# Patient Record
Sex: Male | Born: 1980 | Race: White | Hispanic: No | Smoking: Never smoker
Health system: Southern US, Community
[De-identification: ages and names within clinical notes are randomized; demographics above are authoritative.]

## PROBLEM LIST (undated history)

## (undated) DIAGNOSIS — I10 Essential (primary) hypertension: Secondary | ICD-10-CM

## (undated) HISTORY — PX: WISDOM TOOTH EXTRACTION: SHX21

---

## 2018-01-16 DIAGNOSIS — H1031 Unspecified acute conjunctivitis, right eye: Secondary | ICD-10-CM

## 2018-01-16 DIAGNOSIS — J0100 Acute maxillary sinusitis, unspecified: Secondary | ICD-10-CM

## 2018-01-16 DIAGNOSIS — J029 Acute pharyngitis, unspecified: Secondary | ICD-10-CM

## 2018-01-16 DIAGNOSIS — R030 Elevated blood-pressure reading, without diagnosis of hypertension: Secondary | ICD-10-CM | POA: Insufficient documentation

## 2018-01-16 HISTORY — DX: Unspecified acute conjunctivitis, right eye: H10.31

## 2018-01-16 HISTORY — DX: Acute maxillary sinusitis, unspecified: J01.00

## 2018-01-16 HISTORY — DX: Acute pharyngitis, unspecified: J02.9

## 2019-02-14 ENCOUNTER — Encounter: Payer: Self-pay | Admitting: Osteopathic Medicine

## 2019-02-14 ENCOUNTER — Other Ambulatory Visit: Payer: Self-pay

## 2019-02-14 ENCOUNTER — Ambulatory Visit (INDEPENDENT_AMBULATORY_CARE_PROVIDER_SITE_OTHER): Payer: BC Managed Care – PPO | Admitting: Osteopathic Medicine

## 2019-02-14 VITALS — BP 156/86 | HR 86 | Temp 98.1°F | Ht 69.0 in | Wt 252.4 lb

## 2019-02-14 DIAGNOSIS — Z6837 Body mass index (BMI) 37.0-37.9, adult: Secondary | ICD-10-CM | POA: Diagnosis not present

## 2019-02-14 DIAGNOSIS — R635 Abnormal weight gain: Secondary | ICD-10-CM

## 2019-02-14 DIAGNOSIS — Z7689 Persons encountering health services in other specified circumstances: Secondary | ICD-10-CM | POA: Diagnosis not present

## 2019-02-14 DIAGNOSIS — R5382 Chronic fatigue, unspecified: Secondary | ICD-10-CM | POA: Insufficient documentation

## 2019-02-14 DIAGNOSIS — I10 Essential (primary) hypertension: Secondary | ICD-10-CM

## 2019-02-14 DIAGNOSIS — R002 Palpitations: Secondary | ICD-10-CM

## 2019-02-14 HISTORY — DX: Chronic fatigue, unspecified: R53.82

## 2019-02-14 HISTORY — DX: Palpitations: R00.2

## 2019-02-14 HISTORY — DX: Essential (primary) hypertension: I10

## 2019-02-14 HISTORY — DX: Abnormal weight gain: R63.5

## 2019-02-14 MED ORDER — HYDROCHLOROTHIAZIDE 12.5 MG PO TABS: 12.5000 mg | ORAL_TABLET | Freq: Every day | ORAL | 3 refills | Status: DC

## 2019-02-14 NOTE — Patient Instructions (Addendum)
Plan:  EKG today looks normal  Labs - fasting sometime soon!   Blood pressure: Will send medication to pharmacy.  Keep an eye on BP at home Bring BP monitor to office next time you're here!  See below for DASH and Mediterranean diets for heart health and BP.   Weight: If labs indicate reason for weight issue, will address it depending what it is If labs do not show any reason for weight issue, few options:   Do nothing extra! Work on diet/exercise  Get blood pressure under control before we'd start medication with a stimulant such as Qsymia  Other medication options even if blood pressure isn't perfect would be injectables Saxenda or Ozempic, or pill Contrave  If you're thinking of a medication, please call your insurance company to see if they cover anything. We can also usually get things fairly cheaply with savings cards through the drug manufacturers.       Mediterranean Diet A Mediterranean diet refers to food and lifestyle choices that are based on the traditions of countries located on the [employer]. This way of eating has been shown to help prevent certain conditions and improve outcomes for people who have chronic diseases, like kidney disease and heart disease. What are tips for following this plan? Lifestyle  Cook and eat meals together with your family, when possible.  Drink enough fluid to keep your urine clear or pale yellow.  Be physically active every day. This includes: ? Aerobic exercise like running or swimming. ? Leisure activities like gardening, walking, or housework.  Get 7-8 hours of sleep each night.  If recommended by your health care provider, drink red wine in moderation. This means 1 glass a day for nonpregnant women and 2 glasses a day for men. A glass of wine equals 5 oz (150 mL). Reading food labels   Check the serving size of packaged foods. For foods such as rice and pasta, the serving size refers to the amount of cooked product,  not dry.  Check the total fat in packaged foods. Avoid foods that have saturated fat or trans fats.  Check the ingredients list for added sugars, such as corn syrup. Shopping  At the grocery store, buy most of your food from the areas near the walls of the store. This includes: ? Fresh fruits and vegetables (produce). ? Grains, beans, nuts, and seeds. Some of these may be available in unpackaged forms or large amounts (in bulk). ? Fresh seafood. ? Poultry and eggs. ? Low-fat dairy products.  Buy whole ingredients instead of prepackaged foods.  Buy fresh fruits and vegetables in-season from local farmers markets.  Buy frozen fruits and vegetables in resealable bags.  If you do not have access to quality fresh seafood, buy precooked frozen shrimp or canned fish, such as tuna, salmon, or sardines.  Buy small amounts of raw or cooked vegetables, salads, or olives from the deli or salad bar at your store.  Stock your pantry so you always have certain foods on hand, such as olive oil, canned tuna, canned tomatoes, rice, pasta, and beans. Cooking  Cook foods with extra-virgin olive oil instead of using butter or other vegetable oils.  Have meat as a side dish, and have vegetables or grains as your main dish. This means having meat in small portions or adding small amounts of meat to foods like pasta or stew.  Use beans or vegetables instead of meat in common dishes like chili or lasagna.  Experiment with different  cooking methods. Try roasting or broiling vegetables instead of steaming or sauteing them.  Add frozen vegetables to soups, stews, pasta, or rice.  Add nuts or seeds for added healthy fat at each meal. You can add these to yogurt, salads, or vegetable dishes.  Marinate fish or vegetables using olive oil, lemon juice, garlic, and fresh herbs. Meal planning   Plan to eat 1 vegetarian meal one day each week. Try to work up to 2 vegetarian meals, if possible.  Eat seafood  2 or more times a week.  Have healthy snacks readily available, such as: ? Vegetable sticks with hummus. ? [place] yogurt. ? Fruit and nut trail mix.  Eat balanced meals throughout the week. This includes: ? Fruit: 2-3 servings a day ? Vegetables: 4-5 servings a day ? Low-fat dairy: 2 servings a day ? Fish, poultry, or lean meat: 1 serving a day ? Beans and legumes: 2 or more servings a week ? Nuts and seeds: 1-2 servings a day ? Whole grains: 6-8 servings a day ? Extra-virgin olive oil: 3-4 servings a day  Limit red meat and sweets to only a few servings a month What are my food choices?  Mediterranean diet ? Recommended  Grains: Whole-grain pasta. Brown rice. Bulgar wheat. Polenta. Couscous. Whole-wheat bread. [place].  Vegetables: Artichokes. Beets. Broccoli. Cabbage. Carrots. Eggplant. Green beans. Chard. Kale. Spinach. Onions. Leeks. Peas. Squash. Tomatoes. Peppers. Radishes.  Fruits: Apples. Apricots. Avocado. Berries. Bananas. Cherries. Dates. Figs. Grapes. Lemons. Melon. Oranges. Peaches. Plums. Pomegranate.  Meats and other protein foods: Beans. Almonds. Sunflower seeds. Pine nuts. Peanuts. [place]. Salmon. Scallops. Shrimp. [place]. Tilapia. Clams. Oysters. Eggs.  Dairy: Low-fat milk. Cheese. Greek yogurt.  Beverages: Water. Red wine. Herbal tea.  Fats and oils: Extra virgin olive oil. Avocado oil. Grape seed oil.  Sweets and desserts: [place] yogurt with honey. Baked apples. Poached pears. Trail mix.  Seasoning and other foods: Basil. Cilantro. Coriander. Cumin. Mint. Parsley. Sage. Rosemary. Tarragon. Garlic. Oregano. Thyme. Pepper. Balsalmic vinegar. Tahini. Hummus. Tomato sauce. Olives. Mushrooms. ? Limit these  Grains: Prepackaged pasta or rice dishes. Prepackaged cereal with added sugar.  Vegetables: Deep fried potatoes (french fries).  Fruits: Fruit canned in syrup.  Meats and other protein foods: Beef. Pork. Lamb. Poultry with skin. Hot dogs.  [place] Salines.  Dairy: Ice cream. Sour cream. Whole milk.  Beverages: Juice. Sugar-sweetened soft drinks. Beer. Liquor and spirits.  Fats and oils: Butter. Canola oil. Vegetable oil. Beef fat (tallow). Lard.  Sweets and desserts: Cookies. Cakes. Pies. Candy.  Seasoning and other foods: Mayonnaise. Premade sauces and marinades. The items listed may not be a complete list. Talk with your dietitian about what dietary choices are right for you. Summary  The Mediterranean diet includes both food and lifestyle choices.  Eat a variety of fresh fruits and vegetables, beans, nuts, seeds, and whole grains.  Limit the amount of red meat and sweets that you eat.  Talk with your health care provider about whether it is safe for you to drink red wine in moderation. This means 1 glass a day for nonpregnant women and 2 glasses a day for men. A glass of wine equals 5 oz (150 mL). This information is not intended to replace advice given to you by your health care provider. Make sure you discuss any questions you have with your health care provider. Document Released: 02/04/2016 Document Revised: 02/11/2016 Document Reviewed: 02/04/2016 Elsevier Patient Education  2020 [place] DASH stands for "Dietary  Approaches to Stop Hypertension." The DASH eating plan is a healthy eating plan that has been shown to reduce high blood pressure (hypertension). It may also reduce your risk for type 2 diabetes, heart disease, and stroke. The DASH eating plan may also help with weight loss. What are tips for following this plan?  General guidelines  Avoid eating more than 2,300 mg (milligrams) of salt (sodium) a day. If you have hypertension, you may need to reduce your sodium intake to 1,500 mg a day.  Limit alcohol intake to no more than 1 drink a day for nonpregnant women and 2 drinks a day for men. One drink equals 12 oz of beer, 5 oz of wine, or 1 oz of hard liquor.  Work with your health care  provider to maintain a healthy body weight or to lose weight. Ask what an ideal weight is for you.  Get at least 30 minutes of exercise that causes your heart to beat faster (aerobic exercise) most days of the week. Activities may include walking, swimming, or biking.  Work with your health care provider or diet and nutrition specialist (dietitian) to adjust your eating plan to your individual calorie needs. Reading food labels   Check food labels for the amount of sodium per serving. Choose foods with less than 5 percent of the Daily Value of sodium. Generally, foods with less than 300 mg of sodium per serving fit into this eating plan.  To find whole grains, look for the word "whole" as the first word in the ingredient list. Shopping  Buy products labeled as "low-sodium" or "no salt added."  Buy fresh foods. Avoid canned foods and premade or frozen meals. Cooking  Avoid adding salt when cooking. Use salt-free seasonings or herbs instead of table salt or sea salt. Check with your health care provider or pharmacist before using salt substitutes.  Do not fry foods. Cook foods using healthy methods such as baking, boiling, grilling, and broiling instead.  Cook with heart-healthy oils, such as olive, canola, soybean, or sunflower oil. Meal planning  Eat a balanced diet that includes: ? 5 or more servings of fruits and vegetables each day. At each meal, try to fill half of your plate with fruits and vegetables. ? Up to 6-8 servings of whole grains each day. ? Less than 6 oz of lean meat, poultry, or fish each day. A 3-oz serving of meat is about the same size as a deck of cards. One egg equals 1 oz. ? 2 servings of low-fat dairy each day. ? A serving of nuts, seeds, or beans 5 times each week. ? Heart-healthy fats. Healthy fats called Omega-3 fatty acids are found in foods such as flaxseeds and coldwater fish, like sardines, salmon, and mackerel.  Limit how much you eat of the  following: ? Canned or prepackaged foods. ? Food that is high in trans fat, such as fried foods. ? Food that is high in saturated fat, such as fatty meat. ? Sweets, desserts, sugary drinks, and other foods with added sugar. ? Full-fat dairy products.  Do not salt foods before eating.  Try to eat at least 2 vegetarian meals each week.  Eat more home-cooked food and less restaurant, buffet, and fast food.  When eating at a restaurant, ask that your food be prepared with less salt or no salt, if possible. What foods are recommended? The items listed may not be a complete list. Talk with your dietitian about what dietary choices are best for  you. Grains Whole-grain or whole-wheat bread. Whole-grain or whole-wheat pasta. Brown rice. [place]. Bulgur. Whole-grain and low-sodium cereals. Pita bread. Low-fat, low-sodium crackers. Whole-wheat flour tortillas. Vegetables Fresh or frozen vegetables (raw, steamed, roasted, or grilled). Low-sodium or reduced-sodium tomato and vegetable juice. Low-sodium or reduced-sodium tomato sauce and tomato paste. Low-sodium or reduced-sodium canned vegetables. Fruits All fresh, dried, or frozen fruit. Canned fruit in natural juice (without added sugar). Meat and other protein foods Skinless chicken or [place]. Ground chicken or [place]. Pork with fat trimmed off. Fish and seafood. Egg whites. Dried beans, peas, or lentils. Unsalted nuts, nut butters, and seeds. Unsalted canned beans. Lean cuts of beef with fat trimmed off. Low-sodium, lean deli meat. Dairy Low-fat (1%) or fat-free (skim) milk. Fat-free, low-fat, or reduced-fat cheeses. Nonfat, low-sodium ricotta or cottage cheese. Low-fat or nonfat yogurt. Low-fat, low-sodium cheese. Fats and oils Soft margarine without trans fats. Vegetable oil. Low-fat, reduced-fat, or light mayonnaise and salad dressings (reduced-sodium). Canola, safflower, olive, soybean, and sunflower oils. Avocado. Seasoning and other  foods Herbs. Spices. Seasoning mixes without salt. Unsalted popcorn and pretzels. Fat-free sweets. What foods are not recommended? The items listed may not be a complete list. Talk with your dietitian about what dietary choices are best for you. Grains Baked goods made with fat, such as croissants, muffins, or some breads. Dry pasta or rice meal packs. Vegetables Creamed or fried vegetables. Vegetables in a cheese sauce. Regular canned vegetables (not low-sodium or reduced-sodium). Regular canned tomato sauce and paste (not low-sodium or reduced-sodium). Regular tomato and vegetable juice (not low-sodium or reduced-sodium). Angie Fava. Olives. Fruits Canned fruit in a light or heavy syrup. Fried fruit. Fruit in cream or butter sauce. Meat and other protein foods Fatty cuts of meat. Ribs. Fried meat. [place] Salines. Sausage. Bologna and other processed lunch meats. Salami. Fatback. Hotdogs. Bratwurst. Salted nuts and seeds. Canned beans with added salt. Canned or smoked fish. Whole eggs or egg yolks. Chicken or [place] with skin. Dairy Whole or 2% milk, cream, and half-and-half. Whole or full-fat cream cheese. Whole-fat or sweetened yogurt. Full-fat cheese. Nondairy creamers. Whipped toppings. Processed cheese and cheese spreads. Fats and oils Butter. Stick margarine. Lard. Shortening. Ghee. Bacon fat. Tropical oils, such as coconut, palm kernel, or palm oil. Seasoning and other foods Salted popcorn and pretzels. Onion salt, garlic salt, seasoned salt, table salt, and sea salt. Worcestershire sauce. Tartar sauce. Barbecue sauce. Teriyaki sauce. Soy sauce, including reduced-sodium. Steak sauce. Canned and packaged gravies. Fish sauce. Oyster sauce. Cocktail sauce. Horseradish that you find on the shelf. Ketchup. Mustard. Meat flavorings and tenderizers. Bouillon cubes. Hot sauce and Tabasco sauce. Premade or packaged marinades. Premade or packaged taco seasonings. Relishes. Regular salad dressings. Where to find  more information:  National Heart, Lung, and [place]: https://wilson-eaton.com/  American Heart Association: www.heart.org Summary  The DASH eating plan is a healthy eating plan that has been shown to reduce high blood pressure (hypertension). It may also reduce your risk for type 2 diabetes, heart disease, and stroke.  With the DASH eating plan, you should limit salt (sodium) intake to 2,300 mg a day. If you have hypertension, you may need to reduce your sodium intake to 1,500 mg a day.  When on the DASH eating plan, aim to eat more fresh fruits and vegetables, whole grains, lean proteins, low-fat dairy, and heart-healthy fats.  Work with your health care provider or diet and nutrition specialist (dietitian) to adjust your eating plan to your individual calorie needs. This information is  not intended to replace advice given to you by your health care provider. Make sure you discuss any questions you have with your health care provider. Document Released: 06/02/2011 Document Revised: 05/26/2017 Document Reviewed: 06/06/2016 Elsevier Patient Education  2020 [employer].

## 2019-02-14 NOTE — Addendum Note (Signed)
Addended by: Mertha Finders on: 02/14/2019 02:23 PM   Modules accepted: Orders

## 2019-02-14 NOTE — Progress Notes (Signed)
HPI: Tyler Snyder is a 38 y.o. male who  has no past medical history on file.  he presents to [facility] today, 02/14/19,  for chief complaint of: New to establish care Concern for HTN Inability to lose weight   Pleasant new patient here to establish  Works as Pharmacist, hospital   HTN: Family history of high blood pressure in mom and brother.  Patient reports blood pressure used to be pretty well controlled 120s over 80s but has creeped up a bit since she has experienced some weight gain over the past year.  No chest pain, pressure, shortness of breath.  Reports episode a couple of days ago where he had elevated heart rate measured on his Fitbit throughout the night.  He was a bit worried about this.  Weight gain: Was doing pretty well for a while with diet/exercise but has found it difficult to lose weight over the past few months.  He has tried calorie counting, treadmill exercises.    Past medical, surgical, social and family history reviewed:  Patient Active Problem List   Diagnosis Date Noted  . Essential hypertension 02/14/2019  . BMI 37.0-37.9, adult 02/14/2019  . Abnormal weight gain 02/14/2019  . Chronic fatigue 02/14/2019  . Palpitation 02/14/2019    History reviewed. No pertinent surgical history.  Social History   Tobacco Use  . Smoking status: Never Smoker  . Smokeless tobacco: Never Used  Substance Use Topics  . Alcohol use: Never    Frequency: Never    Family History  Problem Relation Age of Onset  . High blood pressure Mother   . Hyperthyroidism Father   . AAA (abdominal aortic aneurysm) Father      Current medication list and allergy/intolerance information reviewed:    Current Outpatient Medications  Medication Sig Dispense Refill  . hydrochlorothiazide (HYDRODIURIL) 12.5 MG tablet Take 1 tablet (12.5 mg total) by mouth daily. 30 tablet 3   No current facility-administered medications for  this visit.     Allergies  Allergen Reactions  . Codeine Anaphylaxis        Review of Systems:  Constitutional:  No  fever, no chills, No recent illness, +unintentional weight changes. +significant fatigue.   HEENT: No  headache, no vision change, no hearing change, No sore throat, No  sinus pressure  Cardiac: No  chest pain, No  pressure, +palpitations, No  Orthopnea  Respiratory:  No  shortness of breath. No  Cough  Gastrointestinal: No  abdominal pain, No  nausea, No  vomiting,  No  blood in stool, No  diarrhea, No  constipation   Musculoskeletal: No new myalgia/arthralgia  Skin: No  Rash, No other wounds/concerning lesions  Genitourinary: No  incontinence, No  abnormal genital bleeding, No abnormal genital discharge  Hem/Onc: No  easy bruising/bleeding, No  abnormal lymph node  Endocrine: No cold intolerance,  No heat intolerance. No polyuria/polydipsia/polyphagia   Neurologic: No  weakness, No  dizziness, No  slurred speech/focal weakness/facial droop  Psychiatric: No  concerns with depression, No  concerns with anxiety, No sleep problems, No mood problems  Exam:  BP (!) 156/86 (BP Location: Left Arm, Patient Position: Sitting, Cuff Size: Normal)   Pulse 86   Temp 98.1 F (36.7 C) (Oral)   Ht 5\' 9"  (1.753 m)   Wt 252 lb 6.4 oz (114.5 kg)   BMI 37.27 kg/m   Constitutional: VS see above. General Appearance: alert, well-developed, well-nourished, NAD  Eyes: Normal lids and  conjunctive, non-icteric sclera  Ears, Nose, Mouth, Throat:  TM normal bilaterally.  Neck: No masses, trachea midline. No thyroid enlargement. No tenderness/mass appreciated. No lymphadenopathy  Respiratory: Normal respiratory effort. no wheeze, no rhonchi, no rales  Cardiovascular: S1/S2 normal, no murmur, no rub/gallop auscultated. RRR. No lower extremity edema.   Gastrointestinal: Nontender, no masses. No hepatomegaly, no splenomegaly. No hernia appreciated. Bowel sounds normal.  Rectal exam deferred.   Musculoskeletal: Gait normal. No clubbing/cyanosis of digits.   Neurological: Normal balance/coordination. No tremor.  Skin: warm, dry, intact. No rash/ulcer.   Psychiatric: Normal judgment/insight. Normal mood and affect. Oriented x3.    EKG interpretation: Rate: 72 Rhythm: sinus No ST/T changes concerning for acute ischemia/infarct  Previous EKG none available to review      No results found for this or any previous visit (from the past 72 hour(s)).  No results found.       ASSESSMENT/PLAN: The primary encounter diagnosis was Encounter to establish care with new doctor. Diagnoses of Essential hypertension, BMI 37.0-37.9, adult, Abnormal weight gain, Chronic fatigue, and Palpitation were also pertinent to this visit.   Orders Placed This Encounter  Procedures  . CBC  . COMPLETE METABOLIC PANEL WITH GFR  . Lipid panel  . TSH  . Testosterone    Meds ordered this encounter  Medications  . hydrochlorothiazide (HYDRODIURIL) 12.5 MG tablet    Sig: Take 1 tablet (12.5 mg total) by mouth daily.    Dispense:  30 tablet    Refill:  3    Patient Instructions  Plan:  EKG today looks normal  Labs - fasting sometime soon!   Blood pressure: Will send medication to pharmacy.  Keep an eye on BP at home Bring BP monitor to office next time you're here!  See below for DASH and Mediterranean diets for heart health and BP.   Weight: If labs indicate reason for weight issue, will address it depending what it is If labs do not show any reason for weight issue, few options:   Do nothing extra! Work on diet/exercise  Get blood pressure under control before we'd start medication with a stimulant such as Qsymia  Other medication options even if blood pressure isn't perfect would be injectables Saxenda or Ozempic, or pill Contrave  If you're thinking of a medication, please call your insurance company to see if they cover anything. We can also  usually get things fairly cheaply with savings cards through the drug manufacturers.       Mediterranean Diet A Mediterranean diet refers to food and lifestyle choices that are based on the traditions of countries located on the [employer]. This way of eating has been shown to help prevent certain conditions and improve outcomes for people who have chronic diseases, like kidney disease and heart disease. What are tips for following this plan? Lifestyle  Cook and eat meals together with your family, when possible.  Drink enough fluid to keep your urine clear or pale yellow.  Be physically active every day. This includes: ? Aerobic exercise like running or swimming. ? Leisure activities like gardening, walking, or housework.  Get 7-8 hours of sleep each night.  If recommended by your health care provider, drink red wine in moderation. This means 1 glass a day for nonpregnant women and 2 glasses a day for men. A glass of wine equals 5 oz (150 mL). Reading food labels   Check the serving size of packaged foods. For foods such as rice and pasta, the  serving size refers to the amount of cooked product, not dry.  Check the total fat in packaged foods. Avoid foods that have saturated fat or trans fats.  Check the ingredients list for added sugars, such as corn syrup. Shopping  At the grocery store, buy most of your food from the areas near the walls of the store. This includes: ? Fresh fruits and vegetables (produce). ? Grains, beans, nuts, and seeds. Some of these may be available in unpackaged forms or large amounts (in bulk). ? Fresh seafood. ? Poultry and eggs. ? Low-fat dairy products.  Buy whole ingredients instead of prepackaged foods.  Buy fresh fruits and vegetables in-season from local farmers markets.  Buy frozen fruits and vegetables in resealable bags.  If you do not have access to quality fresh seafood, buy precooked frozen shrimp or canned fish, such as  tuna, salmon, or sardines.  Buy small amounts of raw or cooked vegetables, salads, or olives from the deli or salad bar at your store.  Stock your pantry so you always have certain foods on hand, such as olive oil, canned tuna, canned tomatoes, rice, pasta, and beans. Cooking  Cook foods with extra-virgin olive oil instead of using butter or other vegetable oils.  Have meat as a side dish, and have vegetables or grains as your main dish. This means having meat in small portions or adding small amounts of meat to foods like pasta or stew.  Use beans or vegetables instead of meat in common dishes like chili or lasagna.  Experiment with different cooking methods. Try roasting or broiling vegetables instead of steaming or sauteing them.  Add frozen vegetables to soups, stews, pasta, or rice.  Add nuts or seeds for added healthy fat at each meal. You can add these to yogurt, salads, or vegetable dishes.  Marinate fish or vegetables using olive oil, lemon juice, garlic, and fresh herbs. Meal planning   Plan to eat 1 vegetarian meal one day each week. Try to work up to 2 vegetarian meals, if possible.  Eat seafood 2 or more times a week.  Have healthy snacks readily available, such as: ? Vegetable sticks with hummus. ? [place] yogurt. ? Fruit and nut trail mix.  Eat balanced meals throughout the week. This includes: ? Fruit: 2-3 servings a day ? Vegetables: 4-5 servings a day ? Low-fat dairy: 2 servings a day ? Fish, poultry, or lean meat: 1 serving a day ? Beans and legumes: 2 or more servings a week ? Nuts and seeds: 1-2 servings a day ? Whole grains: 6-8 servings a day ? Extra-virgin olive oil: 3-4 servings a day  Limit red meat and sweets to only a few servings a month What are my food choices?  Mediterranean diet ? Recommended  Grains: Whole-grain pasta. Brown rice. Bulgar wheat. Polenta. Couscous. Whole-wheat bread. [place].  Vegetables: Artichokes. Beets.  Broccoli. Cabbage. Carrots. Eggplant. Green beans. Chard. Kale. Spinach. Onions. Leeks. Peas. Squash. Tomatoes. Peppers. Radishes.  Fruits: Apples. Apricots. Avocado. Berries. Bananas. Cherries. Dates. Figs. Grapes. Lemons. Melon. Oranges. Peaches. Plums. Pomegranate.  Meats and other protein foods: Beans. Almonds. Sunflower seeds. Pine nuts. Peanuts. [place]. Salmon. Scallops. Shrimp. [place]. Tilapia. Clams. Oysters. Eggs.  Dairy: Low-fat milk. Cheese. Greek yogurt.  Beverages: Water. Red wine. Herbal tea.  Fats and oils: Extra virgin olive oil. Avocado oil. Grape seed oil.  Sweets and desserts: [place] yogurt with honey. Baked apples. Poached pears. Trail mix.  Seasoning and other foods: Basil. Cilantro. Coriander. Cumin. Mint.  Parsley. Sage. Rosemary. Tarragon. Garlic. Oregano. Thyme. Pepper. Balsalmic vinegar. Tahini. Hummus. Tomato sauce. Olives. Mushrooms. ? Limit these  Grains: Prepackaged pasta or rice dishes. Prepackaged cereal with added sugar.  Vegetables: Deep fried potatoes (french fries).  Fruits: Fruit canned in syrup.  Meats and other protein foods: Beef. Pork. Lamb. Poultry with skin. Hot dogs. Berniece Salines.  Dairy: Ice cream. Sour cream. Whole milk.  Beverages: Juice. Sugar-sweetened soft drinks. Beer. Liquor and spirits.  Fats and oils: Butter. Canola oil. Vegetable oil. Beef fat (tallow). Lard.  Sweets and desserts: Cookies. Cakes. Pies. Candy.  Seasoning and other foods: Mayonnaise. Premade sauces and marinades. The items listed may not be a complete list. Talk with your dietitian about what dietary choices are right for you. Summary  The Mediterranean diet includes both food and lifestyle choices.  Eat a variety of fresh fruits and vegetables, beans, nuts, seeds, and whole grains.  Limit the amount of red meat and sweets that you eat.  Talk with your health care provider about whether it is safe for you to drink red wine in moderation. This means 1 glass a day for  nonpregnant women and 2 glasses a day for men. A glass of wine equals 5 oz (150 mL). This information is not intended to replace advice given to you by your health care provider. Make sure you discuss any questions you have with your health care provider. Document Released: 02/04/2016 Document Revised: 02/11/2016 Document Reviewed: 02/04/2016 Elsevier Patient Education  2020 [place] DASH stands for "Dietary Approaches to Stop Hypertension." The DASH eating plan is a healthy eating plan that has been shown to reduce high blood pressure (hypertension). It may also reduce your risk for type 2 diabetes, heart disease, and stroke. The DASH eating plan may also help with weight loss. What are tips for following this plan?  General guidelines  Avoid eating more than 2,300 mg (milligrams) of salt (sodium) a day. If you have hypertension, you may need to reduce your sodium intake to 1,500 mg a day.  Limit alcohol intake to no more than 1 drink a day for nonpregnant women and 2 drinks a day for men. One drink equals 12 oz of beer, 5 oz of wine, or 1 oz of hard liquor.  Work with your health care provider to maintain a healthy body weight or to lose weight. Ask what an ideal weight is for you.  Get at least 30 minutes of exercise that causes your heart to beat faster (aerobic exercise) most days of the week. Activities may include walking, swimming, or biking.  Work with your health care provider or diet and nutrition specialist (dietitian) to adjust your eating plan to your individual calorie needs. Reading food labels   Check food labels for the amount of sodium per serving. Choose foods with less than 5 percent of the Daily Value of sodium. Generally, foods with less than 300 mg of sodium per serving fit into this eating plan.  To find whole grains, look for the word "whole" as the first word in the ingredient list. Shopping  Buy products labeled as "low-sodium" or "no  salt added."  Buy fresh foods. Avoid canned foods and premade or frozen meals. Cooking  Avoid adding salt when cooking. Use salt-free seasonings or herbs instead of table salt or sea salt. Check with your health care provider or pharmacist before using salt substitutes.  Do not fry foods. Cook foods using healthy methods such as baking,  boiling, grilling, and broiling instead.  Cook with heart-healthy oils, such as olive, canola, soybean, or sunflower oil. Meal planning  Eat a balanced diet that includes: ? 5 or more servings of fruits and vegetables each day. At each meal, try to fill half of your plate with fruits and vegetables. ? Up to 6-8 servings of whole grains each day. ? Less than 6 oz of lean meat, poultry, or fish each day. A 3-oz serving of meat is about the same size as a deck of cards. One egg equals 1 oz. ? 2 servings of low-fat dairy each day. ? A serving of nuts, seeds, or beans 5 times each week. ? Heart-healthy fats. Healthy fats called Omega-3 fatty acids are found in foods such as flaxseeds and coldwater fish, like sardines, salmon, and mackerel.  Limit how much you eat of the following: ? Canned or prepackaged foods. ? Food that is high in trans fat, such as fried foods. ? Food that is high in saturated fat, such as fatty meat. ? Sweets, desserts, sugary drinks, and other foods with added sugar. ? Full-fat dairy products.  Do not salt foods before eating.  Try to eat at least 2 vegetarian meals each week.  Eat more home-cooked food and less restaurant, buffet, and fast food.  When eating at a restaurant, ask that your food be prepared with less salt or no salt, if possible. What foods are recommended? The items listed may not be a complete list. Talk with your dietitian about what dietary choices are best for you. Grains Whole-grain or whole-wheat bread. Whole-grain or whole-wheat pasta. Brown rice. [place]. Bulgur. Whole-grain and low-sodium  cereals. Pita bread. Low-fat, low-sodium crackers. Whole-wheat flour tortillas. Vegetables Fresh or frozen vegetables (raw, steamed, roasted, or grilled). Low-sodium or reduced-sodium tomato and vegetable juice. Low-sodium or reduced-sodium tomato sauce and tomato paste. Low-sodium or reduced-sodium canned vegetables. Fruits All fresh, dried, or frozen fruit. Canned fruit in natural juice (without added sugar). Meat and other protein foods Skinless chicken or [place]. Ground chicken or [place]. Pork with fat trimmed off. Fish and seafood. Egg whites. Dried beans, peas, or lentils. Unsalted nuts, nut butters, and seeds. Unsalted canned beans. Lean cuts of beef with fat trimmed off. Low-sodium, lean deli meat. Dairy Low-fat (1%) or fat-free (skim) milk. Fat-free, low-fat, or reduced-fat cheeses. Nonfat, low-sodium ricotta or cottage cheese. Low-fat or nonfat yogurt. Low-fat, low-sodium cheese. Fats and oils Soft margarine without trans fats. Vegetable oil. Low-fat, reduced-fat, or light mayonnaise and salad dressings (reduced-sodium). Canola, safflower, olive, soybean, and sunflower oils. Avocado. Seasoning and other foods Herbs. Spices. Seasoning mixes without salt. Unsalted popcorn and pretzels. Fat-free sweets. What foods are not recommended? The items listed may not be a complete list. Talk with your dietitian about what dietary choices are best for you. Grains Baked goods made with fat, such as croissants, muffins, or some breads. Dry pasta or rice meal packs. Vegetables Creamed or fried vegetables. Vegetables in a cheese sauce. Regular canned vegetables (not low-sodium or reduced-sodium). Regular canned tomato sauce and paste (not low-sodium or reduced-sodium). Regular tomato and vegetable juice (not low-sodium or reduced-sodium). Angie Fava. Olives. Fruits Canned fruit in a light or heavy syrup. Fried fruit. Fruit in cream or butter sauce. Meat and other protein foods Fatty cuts of meat. Ribs.  Fried meat. Berniece Salines. Sausage. Bologna and other processed lunch meats. Salami. Fatback. Hotdogs. Bratwurst. Salted nuts and seeds. Canned beans with added salt. Canned or smoked fish. Whole eggs or egg yolks. Chicken  or [place] with skin. Dairy Whole or 2% milk, cream, and half-and-half. Whole or full-fat cream cheese. Whole-fat or sweetened yogurt. Full-fat cheese. Nondairy creamers. Whipped toppings. Processed cheese and cheese spreads. Fats and oils Butter. Stick margarine. Lard. Shortening. Ghee. Bacon fat. Tropical oils, such as coconut, palm kernel, or palm oil. Seasoning and other foods Salted popcorn and pretzels. Onion salt, garlic salt, seasoned salt, table salt, and sea salt. Worcestershire sauce. Tartar sauce. Barbecue sauce. Teriyaki sauce. Soy sauce, including reduced-sodium. Steak sauce. Canned and packaged gravies. Fish sauce. Oyster sauce. Cocktail sauce. Horseradish that you find on the shelf. Ketchup. Mustard. Meat flavorings and tenderizers. Bouillon cubes. Hot sauce and Tabasco sauce. Premade or packaged marinades. Premade or packaged taco seasonings. Relishes. Regular salad dressings. Where to find more information:  National Heart, Lung, and [place]: https://wilson-eaton.com/  American Heart Association: www.heart.org Summary  The DASH eating plan is a healthy eating plan that has been shown to reduce high blood pressure (hypertension). It may also reduce your risk for type 2 diabetes, heart disease, and stroke.  With the DASH eating plan, you should limit salt (sodium) intake to 2,300 mg a day. If you have hypertension, you may need to reduce your sodium intake to 1,500 mg a day.  When on the DASH eating plan, aim to eat more fresh fruits and vegetables, whole grains, lean proteins, low-fat dairy, and heart-healthy fats.  Work with your health care provider or diet and nutrition specialist (dietitian) to adjust your eating plan to your individual calorie needs. This  information is not intended to replace advice given to you by your health care provider. Make sure you discuss any questions you have with your health care provider. Document Released: 06/02/2011 Document Revised: 05/26/2017 Document Reviewed: 06/06/2016 Elsevier Patient Education  2020 [employer].         Visit summary with medication list and pertinent instructions was printed for patient to review. All questions at time of visit were answered - patient instructed to contact office with any additional concerns or updates. ER/RTC precautions were reviewed with the patient.     Please note: voice recognition software was used to produce this document, and typos may escape review. Please contact Dr. Sheppard Coil for any needed clarifications.     Follow-up plan: Return in about 2 weeks (around 02/28/2019) for BP recheck and review lab results - bring home monitor! Marland Kitchen

## 2019-02-20 LAB — COMPLETE METABOLIC PANEL WITH GFR
AG Ratio: 1.3 (calc) (ref 1.0–2.5)
ALT: 63 U/L — ABNORMAL HIGH (ref 9–46)
AST: 33 U/L (ref 10–40)
Albumin: 4.4 g/dL (ref 3.6–5.1)
Alkaline phosphatase (APISO): 32 U/L — ABNORMAL LOW (ref 36–130)
BUN: 13 mg/dL (ref 7–25)
CO2: 27 mmol/L (ref 20–32)
Calcium: 9.3 mg/dL (ref 8.6–10.3)
Chloride: 104 mmol/L (ref 98–110)
Creat: 0.79 mg/dL (ref 0.60–1.35)
GFR, Est African American: 132 mL/min/{1.73_m2} (ref >=60)
GFR, Est Non African American: 114 mL/min/{1.73_m2} (ref >=60)
Globulin: 3.4 g/dL (calc) (ref 1.9–3.7)
Glucose, Bld: 97 mg/dL (ref 65–99)
Potassium: 4 mmol/L (ref 3.5–5.3)
Sodium: 139 mmol/L (ref 135–146)
Total Bilirubin: 0.7 mg/dL (ref 0.2–1.2)
Total Protein: 7.8 g/dL (ref 6.1–8.1)

## 2019-02-20 LAB — LIPID PANEL
Cholesterol: 212 mg/dL — ABNORMAL HIGH (ref <200)
HDL: 37 mg/dL — ABNORMAL LOW (ref >=40)
LDL Cholesterol (Calc): 153 mg/dL (calc) — ABNORMAL HIGH
Non-HDL Cholesterol (Calc): 175 mg/dL (calc) — ABNORMAL HIGH (ref <130)
Total CHOL/HDL Ratio: 5.7 (calc) — ABNORMAL HIGH (ref <5.0)
Triglycerides: 102 mg/dL (ref <150)

## 2019-02-20 LAB — TSH: TSH: 1.29 mIU/L (ref 0.40–4.50)

## 2019-02-20 LAB — CBC
HCT: 41 % (ref 38.5–50.0)
Hemoglobin: 13.9 g/dL (ref 13.2–17.1)
MCH: 29.3 pg (ref 27.0–33.0)
MCHC: 33.9 g/dL (ref 32.0–36.0)
MCV: 86.3 fL (ref 80.0–100.0)
MPV: 10.1 fL (ref 7.5–12.5)
Platelets: 214 10*3/uL (ref 140–400)
RBC: 4.75 10*6/uL (ref 4.20–5.80)
RDW: 12.2 % (ref 11.0–15.0)
WBC: 6.4 10*3/uL (ref 3.8–10.8)

## 2019-02-20 LAB — TESTOSTERONE: Testosterone: 375 ng/dL (ref 250–827)

## 2019-02-28 ENCOUNTER — Other Ambulatory Visit: Payer: Self-pay

## 2019-02-28 ENCOUNTER — Encounter: Payer: Self-pay | Admitting: Osteopathic Medicine

## 2019-02-28 ENCOUNTER — Ambulatory Visit (INDEPENDENT_AMBULATORY_CARE_PROVIDER_SITE_OTHER): Payer: BC Managed Care – PPO | Admitting: Osteopathic Medicine

## 2019-02-28 VITALS — BP 135/95 | HR 79 | Temp 98.7°F | Wt 244.0 lb

## 2019-02-28 DIAGNOSIS — I10 Essential (primary) hypertension: Secondary | ICD-10-CM

## 2019-02-28 DIAGNOSIS — R5382 Chronic fatigue, unspecified: Secondary | ICD-10-CM | POA: Diagnosis not present

## 2019-02-28 DIAGNOSIS — R635 Abnormal weight gain: Secondary | ICD-10-CM

## 2019-02-28 DIAGNOSIS — R748 Abnormal levels of other serum enzymes: Secondary | ICD-10-CM | POA: Insufficient documentation

## 2019-02-28 HISTORY — DX: Abnormal levels of other serum enzymes: R74.8

## 2019-02-28 MED ORDER — HYDROCHLOROTHIAZIDE 12.5 MG PO TABS: 25.0000 mg | ORAL_TABLET | Freq: Every day | ORAL | 0 refills | Status: DC

## 2019-02-28 MED ORDER — HYDROCHLOROTHIAZIDE 12.5 MG PO TABS: 12.5000 mg | ORAL_TABLET | Freq: Every day | ORAL | 0 refills | Status: DC

## 2019-02-28 NOTE — Patient Instructions (Addendum)
Increase HCTZ from 12.5 to 25 mg  BP goal 130/80 or less If side effects of higher dose, let me know and will go back to 12.5 plus low dose of something else   Will recheck labs in 3-4 months (choelsterol, liver enzymes)

## 2019-02-28 NOTE — Progress Notes (Signed)
HPI: Tyler Snyder is a 38 y.o. male who  has no past medical history on file.  he presents to [facility] today, 02/28/19,  for chief complaint of:  BP follow-up  Doing well Some mild dry mouth w/ HCTZ but not really bothering him BP at home still in [id number] systolic on occasion He didn't bring home BP monitor today   At today's visit 02/28/19 ... PMH, PSH, FH reviewed and updated as needed.  Current medication list and allergy/intolerance hx reviewed and updated as needed. (See remainder of HPI, ROS, Phys Exam below)  BP Readings from Last 3 Encounters:  02/28/19 (!) 135/95  02/14/19 (!) 156/86    No results found.  No results found for this or any previous visit (from the past 72 hour(s)).        ASSESSMENT/PLAN: The primary encounter diagnosis was Essential hypertension. Diagnoses of Chronic fatigue, Abnormal weight gain, and Elevated liver enzymes were also pertinent to this visit.   No orders of the defined types were placed in this encounter.  The ASCVD Risk score [name], et al., 2013) failed to calculate for the following reasons:   The 2013 ASCVD risk score is only valid for ages 56 to 84    Meds ordered this encounter  Medications  . DISCONTD: hydrochlorothiazide (HYDRODIURIL) 12.5 MG tablet    Sig: Take 1 tablet (12.5 mg total) by mouth daily.    Dispense:  30 tablet    Refill:  0  . hydrochlorothiazide (HYDRODIURIL) 12.5 MG tablet    Sig: Take 2 tablets (25 mg total) by mouth daily.    Dispense:  30 tablet    Refill:  0    Cancel 12.5 mg dose sent today 02/28/19 thanks!    Patient Instructions  Increase HCTZ from 12.5 to 25 mg  BP goal 130/80 or less If side effects of higher dose, let me know and will go back to 12.5 plus low dose of something else   Will recheck labs in 3-4 months (choelsterol, liver enzymes)        Follow-up plan: Return in about 2 weeks (around 03/14/2019) for recheck BP,  bring home monitor with you to visit! .                                                 ################################################# ################################################# ################################################# #################################################    Current Meds  Medication Sig  . hydrochlorothiazide (HYDRODIURIL) 12.5 MG tablet Take 2 tablets (25 mg total) by mouth daily.  . [DISCONTINUED] hydrochlorothiazide (HYDRODIURIL) 12.5 MG tablet Take 1 tablet (12.5 mg total) by mouth daily.  . [DISCONTINUED] hydrochlorothiazide (HYDRODIURIL) 12.5 MG tablet Take 1 tablet (12.5 mg total) by mouth daily.    Allergies  Allergen Reactions  . Codeine Anaphylaxis       Review of Systems:  Constitutional: No recent illness  HEENT: No  headache, no vision change  Cardiac: No  chest pain, No  pressure, No palpitations  Respiratory:  No  shortness of breath. No  Cough  Musculoskeletal: No new myalgia/arthralgia  Psychiatric: No  concerns with depression, No  concerns with anxiety  Exam:  BP (!) 135/95   Pulse 79   Temp 98.7 F (37.1 C) (Oral)   Wt 244 lb (110.7 kg)   BMI 36.03 kg/m   Constitutional: VS see above.  General Appearance: alert, well-developed, well-nourished, NAD  Neck: No masses, trachea midline.   Respiratory: Normal respiratory effort. no wheeze, no rhonchi, no rales  Cardiovascular: S1/S2 normal, no murmur, no rub/gallop auscultated. RRR.   Musculoskeletal: Gait normal. Symmetric and independent movement of all extremities  Neurological: Normal balance/coordination. No tremor.  Skin: warm, dry, intact.   Psychiatric: Normal judgment/insight. Normal mood and affect. Oriented x3.       Visit summary with medication list and pertinent instructions was printed for patient to review, patient was advised to alert [place] if any updates are needed. All questions at time of visit  were answered - patient instructed to contact office with any additional concerns. ER/RTC precautions were reviewed with the patient and understanding verbalized.     Please note: voice recognition software was used to produce this document, and typos may escape review. Please contact Dr. Sheppard Coil for any needed clarifications.    Follow up plan: Return in about 2 weeks (around 03/14/2019) for recheck BP, bring home monitor with you to visit! Marland Kitchen

## 2019-03-14 ENCOUNTER — Encounter: Payer: Self-pay | Admitting: Osteopathic Medicine

## 2019-03-14 ENCOUNTER — Ambulatory Visit (INDEPENDENT_AMBULATORY_CARE_PROVIDER_SITE_OTHER): Payer: BC Managed Care – PPO | Admitting: Osteopathic Medicine

## 2019-03-14 ENCOUNTER — Other Ambulatory Visit: Payer: Self-pay

## 2019-03-14 VITALS — BP 147/88 | HR 86 | Temp 98.2°F | Wt 246.5 lb

## 2019-03-14 DIAGNOSIS — I10 Essential (primary) hypertension: Secondary | ICD-10-CM | POA: Diagnosis not present

## 2019-03-14 NOTE — Progress Notes (Signed)
HPI: Tyler Snyder is a 38 y.o. male who  has no past medical history on file.  he presents to [facility] today, 03/14/19,  for chief complaint of:  BP follow-up   Home readings consistently in 130's/80's Working on diet & weight loss Dry mouth issues have resolved   BP Readings from Last 3 Encounters:  03/14/19 (!) 147/88 ours, 139/96 his.   02/28/19 (!) 135/95 - we increased HCTZ to 25 mg  02/14/19 (!) 156/86 - we started HCTZ 12.5 mg     Wt Readings from Last 3 Encounters:  03/14/19 246 lb 8 oz (111.8 kg)  02/28/19 244 lb (110.7 kg)  02/14/19 252 lb 6.4 oz (114.5 kg)      At today's visit 03/14/19 ... PMH, PSH, FH reviewed and updated as needed.  Current medication list and allergy/intolerance hx reviewed and updated as needed. (See remainder of HPI, ROS, Phys Exam below)   No results found.  No results found for this or any previous visit (from the past 72 hour(s)).        ASSESSMENT/PLAN: The encounter diagnosis was Essential hypertension.   Would like to hold off on med adjustment at this time, work on lifestyle modifications  Orders Placed This Encounter  Procedures  . COMPLETE METABOLIC PANEL WITH GFR  . Lipid panel    labs ordered for future visit      Follow-up plan: Return in about 3 months (around 06/13/2019) for recheck BP, labs. Get blood owrk 2-3 days before visit! .                                                 ################################################# ################################################# ################################################# #################################################    No outpatient medications have been marked as taking for the 03/14/19 encounter (Office Visit) with Tyler Reeve, DO.    Allergies  Allergen Reactions  . Codeine Anaphylaxis       Review of Systems:  Constitutional: No  recent illness  HEENT: No  headache, no vision change   Cardiac: No  chest pain, No  pressure, No palpitations  Respiratory:  No  shortness of breath  Neurologic: No  weakness, No  Dizziness   Exam:  BP (!) 147/88 (BP Location: Left Arm, Patient Position: Sitting, Cuff Size: Large)   Pulse 86   Temp 98.2 F (36.8 C) (Oral)   Wt 246 lb 8 oz (111.8 kg)   BMI 36.40 kg/m   Constitutional: VS see above. General Appearance: alert, well-developed, well-nourished, NAD  Neck: No masses, trachea midline.   Respiratory: Normal respiratory effort.  Psychiatric: Normal judgment/insight. Normal mood and affect. Oriented x3.       Visit summary with medication list and pertinent instructions was printed for patient to review, patient was advised to alert Tyler Snyder if any updates are needed. All questions at time of visit were answered - patient instructed to contact office with any additional concerns. ER/RTC precautions were reviewed with the patient and understanding verbalized.   Note: Total time spent 10 minutes, greater than 50% of the visit was spent face-to-face counseling and coordinating care for the following: The encounter diagnosis was Essential hypertension..  Please note: voice recognition software was used to produce this document, and typos may escape review. Please contact Dr. Sheppard Coil for any needed clarifications.    Follow up plan: Return in about  3 months (around 06/13/2019) for recheck BP, labs. Get blood owrk 2-3 days before visit! Marland Kitchen

## 2019-06-13 ENCOUNTER — Ambulatory Visit: Payer: BC Managed Care – PPO | Admitting: Osteopathic Medicine

## 2019-12-17 ENCOUNTER — Other Ambulatory Visit: Payer: Self-pay

## 2019-12-17 MED ORDER — HYDROCHLOROTHIAZIDE 12.5 MG PO TABS: 25.0000 mg | ORAL_TABLET | Freq: Every day | ORAL | 0 refills | Status: DC

## 2020-01-23 ENCOUNTER — Telehealth: Payer: Self-pay | Admitting: Osteopathic Medicine

## 2020-01-23 DIAGNOSIS — E7800 Pure hypercholesterolemia, unspecified: Secondary | ICD-10-CM

## 2020-01-23 DIAGNOSIS — I10 Essential (primary) hypertension: Secondary | ICD-10-CM

## 2020-01-23 DIAGNOSIS — R748 Abnormal levels of other serum enzymes: Secondary | ICD-10-CM

## 2020-01-23 NOTE — Telephone Encounter (Signed)
Pt called. He said he is overdue for labwork. Wants lab order sent down prior to appt on August 12th.  Thanks

## 2020-01-28 ENCOUNTER — Emergency Department (INDEPENDENT_AMBULATORY_CARE_PROVIDER_SITE_OTHER): Payer: BC Managed Care – PPO

## 2020-01-28 ENCOUNTER — Other Ambulatory Visit: Payer: Self-pay

## 2020-01-28 ENCOUNTER — Emergency Department
Admission: EM | Admit: 2020-01-28 | Discharge: 2020-01-28 | Disposition: A | Discharge status: Home / Self Care | Payer: BC Managed Care – PPO | Source: Home / Self Care | Attending: Family Medicine | Admitting: Family Medicine

## 2020-01-28 DIAGNOSIS — R0789 Other chest pain: Secondary | ICD-10-CM | POA: Diagnosis not present

## 2020-01-28 DIAGNOSIS — I10 Essential (primary) hypertension: Secondary | ICD-10-CM

## 2020-01-28 HISTORY — DX: Essential (primary) hypertension: I10

## 2020-01-28 MED ORDER — LOSARTAN POTASSIUM 25 MG PO TABS
25.0000 mg | ORAL_TABLET | Freq: Every day | ORAL | 0 refills | Status: DC
Start: 2020-01-28 — End: 2020-02-06

## 2020-01-28 NOTE — ED Triage Notes (Signed)
Patient presents to Urgent Care with complaints of chest pressure/ tightness with exertion since the past several weeks, but worse over the past 4-5 days. Patient reports his HR has been increasing w/ minimal exertion recently (like walking from the bathroom to his desk). Pt denies sick contacts, did have his covid (moderna) vaccine 4 days ago.

## 2020-01-28 NOTE — Telephone Encounter (Signed)
CMP and Lipid Panel re-ordered

## 2020-01-28 NOTE — ED Provider Notes (Addendum)
[name]    CSN: [id number] Arrival date & time: 01/28/20  1608      History   Chief Complaint Chief Complaint  Patient presents with  . Chest Pain    x3 weeks    HPI Tyler Snyder is a 39 y.o. male.   Patient complains of several week history of constant vague anterior chest pressure/tightness, worse with exertion, and more pronounced during the past 4 to 5 days.  He reports that his heart rate increases with minimal exertion such as walking from bathroom to his desk.  The discomfort does not radiate.  He denies nausea/vomiting and shortness of breath. His family history is significant for multiple paternal relatives with ASCVD (his father had MI age 3), and mother with hypertension. Patient states that he has an appointment with his PCP next week for follow-up.  The history is provided by the patient.  Chest Pain Pain location:  Substernal area Pain quality: pressure and tightness   Pain radiates to:  Does not radiate Pain severity:  Mild Onset quality:  Gradual Duration:  3 weeks Timing:  Constant Progression:  Worsening Chronicity:  New Context comment:  Activity Relieved by:  Nothing Worsened by:  Exertion Ineffective treatments:  None tried Associated symptoms: no abdominal pain, no AICD problem, no anorexia, no anxiety, no back pain, no cough, no diaphoresis, no dysphagia, no fatigue, no fever, no headache, no heartburn, no lower extremity edema, no nausea, no near-syncope, no palpitations, no PND and no shortness of breath   Risk factors: high cholesterol, hypertension, male sex and obesity     Past Medical History:  Diagnosis Date  . Hypertension     Patient Active Problem List   Diagnosis Date Noted  . Elevated liver enzymes 02/28/2019  . Essential hypertension 02/14/2019  . BMI 37.0-37.9, adult 02/14/2019  . Abnormal weight gain 02/14/2019  . Chronic fatigue 02/14/2019  . Palpitation 02/14/2019    History reviewed. No pertinent  surgical history.     Home Medications    Prior to Admission medications   Medication Sig Start Date End Date Taking? Authorizing Provider  hydrochlorothiazide (HYDRODIURIL) 12.5 MG tablet Take 2 tablets (25 mg total) by mouth daily. 12/17/19   [name], DO  losartan (COZAAR) 25 MG tablet Take 1 tablet (25 mg total) by mouth daily. 01/28/20 01/27/21  Kandra Nicolas, MD    Family History Family History  Problem Relation Age of Onset  . High blood pressure Mother   . Hypertension Mother   . Hyperthyroidism Father   . AAA (abdominal aortic aneurysm) Father     Social History Social History   Tobacco Use  . Smoking status: Never Smoker  . Smokeless tobacco: Never Used  Vaping Use  . Vaping Use: Never used  Substance Use Topics  . Alcohol use: Never  . Drug use: Never     Allergies   Codeine   Review of Systems Review of Systems  Constitutional: Negative for activity change, appetite change, chills, diaphoresis, fatigue and fever.  HENT: Negative.  Negative for trouble swallowing.   Eyes: Negative.   Respiratory: Positive for chest tightness. Negative for cough, shortness of breath, wheezing and stridor.   Cardiovascular: Positive for chest pain. Negative for palpitations, leg swelling, PND and near-syncope.  Gastrointestinal: Negative for abdominal pain, anorexia, heartburn and nausea.  Genitourinary: Negative.   Musculoskeletal: Negative for back pain.  Skin: Negative.   Neurological: Negative for headaches.     Physical Exam Triage Vital Signs  ED Triage Vitals  Enc Vitals Group     BP 01/28/20 1628 (!) 167/119     Pulse Rate 01/28/20 1628 83     Resp 01/28/20 1628 18     Temp 01/28/20 1628 99.4 F (37.4 C)     Temp Source 01/28/20 1628 Oral     SpO2 01/28/20 1628 98 %     Weight --      Height --      Head Circumference --      Peak Flow --      Pain Score 01/28/20 1626 2     Pain Loc --      Pain Edu? --      Excl. in [place]? --    No data  found.  Updated Vital Signs BP (!) 167/119 (BP Location: Right Arm)   Pulse 83   Temp 99.4 F (37.4 C) (Oral)   Resp 18   SpO2 98%   Visual Acuity Right Eye Distance:   Left Eye Distance:   Bilateral Distance:    Right Eye Near:   Left Eye Near:    Bilateral Near:     Physical Exam Vitals and nursing note reviewed.  Constitutional:      General: He is not in acute distress.    Appearance: He is not ill-appearing.  HENT:     Head: Normocephalic.     Right Ear: External ear normal.     Left Ear: External ear normal.     Mouth/Throat:     Mouth: Mucous membranes are moist.     Pharynx: Oropharynx is clear.  Eyes:     Conjunctiva/sclera: Conjunctivae normal.     Pupils: Pupils are equal, round, and reactive to light.  Neck:     Thyroid: No thyromegaly.  Cardiovascular:     Rate and Rhythm: Normal rate and regular rhythm.     Heart sounds: Normal heart sounds.  Pulmonary:     Breath sounds: Normal breath sounds.  Chest:       Comments: There is tenderness to palpation upper sternum extending to left medial pectoralis muscle. Abdominal:     Palpations: Abdomen is soft.     Tenderness: There is no abdominal tenderness.  Musculoskeletal:        General: No swelling or tenderness.     Cervical back: Normal range of motion and neck supple.     Right lower leg: No edema.     Left lower leg: No edema.  Lymphadenopathy:     Cervical: No cervical adenopathy.  Skin:    General: Skin is warm and dry.     Findings: No rash.  Neurological:     Mental Status: He is alert and oriented to person, place, and time.      UC Treatments / Results  Labs (all labs ordered are listed, but only abnormal results are displayed) Labs Reviewed  TROPONIN I (HIGH SENSITIVITY)    EKG  Rate:  93 BPM PR:  150 msec QT:  334 msec QTcH:  415 msec QRSD:  86 msec QRS axis:  26 degrees Interpretation:  normal sinus rhythm; no acute changes.  Radiology DG Chest 2 View  Result  Date: 01/28/2020 CLINICAL DATA:  Substernal chest pain x3 weeks. EXAM: CHEST - 2 VIEW COMPARISON:  None. FINDINGS: There is no evidence of acute infiltrate, pleural effusion or pneumothorax. The heart size and mediastinal contours are within normal limits. The visualized skeletal structures are unremarkable. IMPRESSION: No active cardiopulmonary disease.  Electronically Signed   By: Virgina Norfolk M.D.   On: 01/28/2020 17:47    Procedures Procedures (including critical care time)  Medications Ordered in UC Medications - No data to display  Initial Impression / Assessment and Plan / UC Course  I have reviewed the triage vital signs and the nursing notes.  Pertinent labs & imaging results that were available during my care of the patient were reviewed by me and considered in my medical decision making (see chart for details).    Patient's constant chest discomfort for several weeks is most consistent with musculoskeletal pain (e.g. costochondritis).  Negative chest X-ray, and EKG minimal change from review of tracing done 14 February 2019.  However, he has multiple risk factors for ASCVD including hyperlipidemia, hypertension, and strong family history.  As a precaution, will check Troponin I (to ED if elevated). Note hypertension no longer controlled with HCTZ 25mg  daily. Begin starting dose Losartan 25mg  daily (continue HCTZ).  Followup with Family Doctor next week as scheduled. Recommend referral to cardiologist for stress testing.   Final Clinical Impressions(s) / UC Diagnoses   Final diagnoses:  Atypical chest pain  Uncontrolled hypertension     Discharge Instructions     Please monitor your blood pressure several times weekly, at different times of the day, record on a calendar with times of measurement and take this record to your Family Doctor.  If symptoms become significantly worse during the night or over the weekend, proceed to the local emergency room.     ED  Prescriptions    Medication Sig Dispense Auth. Provider   losartan (COZAAR) 25 MG tablet Take 1 tablet (25 mg total) by mouth daily. 30 tablet Kandra Nicolas, MD        Kandra Nicolas, MD 02/01/20 1911  Addendum: Troponin I negative (11ng/L; normal range < OR = 47ng/L)    Kandra Nicolas, MD 02/01/20 [phone]

## 2020-01-28 NOTE — Discharge Instructions (Addendum)
Please monitor your blood pressure several times weekly, at different times of the day, record on a calendar with times of measurement and take this record to your Family Doctor.  If symptoms become significantly worse during the night or over the weekend, proceed to the local emergency room.

## 2020-01-29 ENCOUNTER — Encounter: Payer: Self-pay | Admitting: Osteopathic Medicine

## 2020-01-29 LAB — TROPONIN I: Troponin I: 11 ng/L (ref <=47)

## 2020-01-29 NOTE — Telephone Encounter (Signed)
Task completed. Pt notified via MyChart msg that labs have been ordered. Aware of [facility] schedule and hours.

## 2020-01-30 NOTE — Telephone Encounter (Signed)
Thank you :)

## 2020-02-04 LAB — COMPLETE METABOLIC PANEL WITH GFR
AG Ratio: 1.2 (calc) (ref 1.0–2.5)
ALT: 55 U/L — ABNORMAL HIGH (ref 9–46)
AST: 30 U/L (ref 10–40)
Albumin: 4.1 g/dL (ref 3.6–5.1)
Alkaline phosphatase (APISO): 33 U/L — ABNORMAL LOW (ref 36–130)
BUN: 13 mg/dL (ref 7–25)
CO2: 24 mmol/L (ref 20–32)
Calcium: 9.1 mg/dL (ref 8.6–10.3)
Chloride: 104 mmol/L (ref 98–110)
Creat: 0.79 mg/dL (ref 0.60–1.35)
GFR, Est African American: 131 mL/min/{1.73_m2} (ref >=60)
GFR, Est Non African American: 113 mL/min/{1.73_m2} (ref >=60)
Globulin: 3.3 g/dL (calc) (ref 1.9–3.7)
Glucose, Bld: 103 mg/dL — ABNORMAL HIGH (ref 65–99)
Potassium: 4.1 mmol/L (ref 3.5–5.3)
Sodium: 138 mmol/L (ref 135–146)
Total Bilirubin: 0.5 mg/dL (ref 0.2–1.2)
Total Protein: 7.4 g/dL (ref 6.1–8.1)

## 2020-02-04 LAB — LIPID PANEL W/REFLEX DIRECT LDL
Cholesterol: 181 mg/dL (ref <200)
HDL: 30 mg/dL — ABNORMAL LOW (ref >=40)
LDL Cholesterol (Calc): 130 mg/dL (calc) — ABNORMAL HIGH
Non-HDL Cholesterol (Calc): 151 mg/dL (calc) — ABNORMAL HIGH (ref <130)
Total CHOL/HDL Ratio: 6 (calc) — ABNORMAL HIGH (ref <5.0)
Triglycerides: 103 mg/dL (ref <150)

## 2020-02-04 LAB — HEMOGLOBIN A1C W/OUT EAG

## 2020-02-06 ENCOUNTER — Ambulatory Visit (INDEPENDENT_AMBULATORY_CARE_PROVIDER_SITE_OTHER): Payer: BC Managed Care – PPO | Admitting: Osteopathic Medicine

## 2020-02-06 ENCOUNTER — Encounter: Payer: Self-pay | Admitting: Osteopathic Medicine

## 2020-02-06 VITALS — BP 141/91 | HR 92 | Wt 253.0 lb

## 2020-02-06 DIAGNOSIS — R1012 Left upper quadrant pain: Secondary | ICD-10-CM

## 2020-02-06 DIAGNOSIS — I10 Essential (primary) hypertension: Secondary | ICD-10-CM | POA: Diagnosis not present

## 2020-02-06 DIAGNOSIS — R9431 Abnormal electrocardiogram [ECG] [EKG]: Secondary | ICD-10-CM

## 2020-02-06 DIAGNOSIS — R0789 Other chest pain: Secondary | ICD-10-CM | POA: Diagnosis not present

## 2020-02-06 DIAGNOSIS — K921 Melena: Secondary | ICD-10-CM

## 2020-02-06 MED ORDER — ATORVASTATIN CALCIUM 40 MG PO TABS: 40.0000 mg | ORAL_TABLET | Freq: Every day | ORAL | 3 refills | Status: DC

## 2020-02-06 MED ORDER — LOSARTAN POTASSIUM 25 MG PO TABS
50.0000 mg | ORAL_TABLET | Freq: Every day | ORAL | 0 refills | Status: DC
Start: 2020-02-06 — End: 2020-02-17

## 2020-02-06 MED ORDER — ASPIRIN EC 81 MG PO TBEC: 81.0000 mg | DELAYED_RELEASE_TABLET | Freq: Every day | ORAL | 3 refills | Status: DC

## 2020-02-06 NOTE — Patient Instructions (Addendum)
Plan:  Some EKG changes compared to 01/2019  Nothing definitive for heart attack  Referral placed for cardiology  Will start statin and aspirin medications for risk reduction  Will increase Losartan from 25 mg to 50 mg (2 pills per day)  IF WORSE CHEST PAIN / OTHER CONCERNING SYMPTOMS - TO ER!

## 2020-02-06 NOTE — Progress Notes (Signed)
HPI: Tyler Snyder is a 39 y.o. male who  has a past medical history of Hypertension.  he presents to [facility] today, 02/06/20,  for chief complaint of: Chest pain Blood in stool  Patient reports several months of intermittent chest tightness. He states this is mostly with exertion such as walking, but sometimes happens at rest as well. He denies radiation of the pain, but does endorse some mild SOB with pain. He states this has become more consistent and frequent recently, so he presented to UC last week. Per records review, he had an EKG at that time, which was not concerning for an acute MI, but had small changes from previous, including new T wave inversions and T wave flattening. His troponin at that time was normal but was not trended. His BP was elevated to 160s/120 during that visit, so he was started on losartan 25mg  in addition to his previous HCTZ 25mg . He denies significant cardiac history in his immediate family, but his father's family has extensive cardiac history, including a paternal uncle who died at 25 of an MI.  He also reports an episode of BRB in his stool about 2 weeks ago. He states he had some LUQ pain for two nights when he went to bed prior to that episode. He denies any recurrence of these symptoms since that time. He has not noted any hemorrhoids. He denies nausea, vomiting, diarrhea, constipation, painful defecation.   Past medical, surgical, social and family history reviewed:  Patient Active Problem List   Diagnosis Date Noted  . Elevated liver enzymes 02/28/2019  . Essential hypertension 02/14/2019  . BMI 37.0-37.9, adult 02/14/2019  . Abnormal weight gain 02/14/2019  . Chronic fatigue 02/14/2019  . Palpitation 02/14/2019    No past surgical history on file.  Social History   Tobacco Use  . Smoking status: Never Smoker  . Smokeless tobacco: Never Used  Substance Use Topics  . Alcohol use: Never    Family  History  Problem Relation Age of Onset  . High blood pressure Mother   . Hypertension Mother   . Hyperthyroidism Father   . AAA (abdominal aortic aneurysm) Father      Current medication list and allergy/intolerance information reviewed:    Current Outpatient Medications  Medication Sig Dispense Refill  . hydrochlorothiazide (HYDRODIURIL) 12.5 MG tablet Take 2 tablets (25 mg total) by mouth daily. 180 tablet 0  . losartan (COZAAR) 25 MG tablet Take 2 tablets (50 mg total) by mouth daily. 30 tablet 0  . aspirin EC 81 MG tablet Take 1 tablet (81 mg total) by mouth daily. 90 tablet 3  . atorvastatin (LIPITOR) 40 MG tablet Take 1 tablet (40 mg total) by mouth daily. 90 tablet 3   No current facility-administered medications for this visit.    Allergies  Allergen Reactions  . Codeine Anaphylaxis      Review of Systems:  Constitutional:  No  fever, no chills  HEENT: No  headache, no vision change  Cardiac: + chest tightness, No  pressure, No palpitations, No  Orthopnea  Respiratory:  +  shortness of breath. No  Cough  Gastrointestinal: +  abdominal pain, No  nausea, No  vomiting,  +  blood in stool, No  diarrhea, No  constipation   Musculoskeletal: No new myalgia/arthralgia  Skin: No  Rash, No other wounds/concerning lesions  Neurologic: No  weakness, No  dizziness  Psychiatric: No  concerns with depression, No  concerns with anxiety,  No sleep problems, No mood problems  Exam:  BP (!) 141/91 (BP Location: Left Arm, Patient Position: Sitting)   Pulse 92   Wt 253 lb (114.8 kg)   SpO2 98%   BMI 37.36 kg/m   Constitutional: VS see above. General Appearance: alert, well-developed, well-nourished, NAD  Eyes: Normal lids and conjunctive, non-icteric sclera   Neck: No masses, trachea midline.  Respiratory: Normal respiratory effort. no wheeze, no rhonchi, no rales  Cardiovascular: S1/S2 normal, no murmur, no rub/gallop auscultated. RRR. No lower extremity edema.    Gastrointestinal: Nontender, no masses. No hepatomegaly, no splenomegaly. No hernia appreciated. Bowel sounds normal. Rectal exam unremarkable. No hemorrhoids, masses, or lesions noted. Hemoccult negative.  Musculoskeletal: Gait normal. No clubbing/cyanosis of digits.   Neurological: Normal balance/coordination. No tremor. No cranial nerve deficit on limited exam.   Skin: warm, dry, intact. No rash/ulcer. No concerning nevi or subq nodules on limited exam.   Psychiatric: Normal judgment/insight. Normal mood and affect. Oriented x3.    No results found for this or any previous visit (from the past 72 hour(s)).  No results found.   ASSESSMENT/PLAN: The primary encounter diagnosis was Atypical chest pain. Diagnoses of Abnormal EKG, Essential hypertension, Blood in stool, and LUQ pain were also pertinent to this visit.   Chest pain  Pt history is atypical for chest pain, but given extensive family history and recent EKG changes, heart disease is a significant concern.   Referred patient to Cardiology for stress testing / other recommendatoins.  ASCVD risk is 2.7%. However, given concern for ischemic heart disease potential, prescribed statin and ASA and optimized BP meds until cardiology follow up.   Hypertension  BP 141/91 today. Pt states this is consistent with home measurements.  Increased losartan to 50mg . Continue HCTZ 25mg .  Blood in Stool  Hemoccult negative in office today. No hemorrhoids or other abnormalities noted on rectal exam.  If symptoms recur, patient should return to office for referral to GI for colonoscopy evaluation.  Orders Placed This Encounter  Procedures  . Ambulatory referral to Cardiology  . Ambulatory referral to Gastroenterology    Meds ordered this encounter  Medications  . atorvastatin (LIPITOR) 40 MG tablet    Sig: Take 1 tablet (40 mg total) by mouth daily.    Dispense:  90 tablet    Refill:  3  . aspirin EC 81 MG tablet    Sig:  Take 1 tablet (81 mg total) by mouth daily.    Dispense:  90 tablet    Refill:  3  . losartan (COZAAR) 25 MG tablet    Sig: Take 2 tablets (50 mg total) by mouth daily.    Dispense:  30 tablet    Refill:  0    Patient Instructions  Plan:  Some EKG changes compared to 01/2019  Nothing definitive for heart attack  Referral placed for cardiology  Will start statin and aspirin medications for risk reduction  Will increase Losartan from 25 mg to 50 mg (2 pills per day)  IF WORSE CHEST PAIN / OTHER CONCERNING SYMPTOMS - TO ER!          Visit summary with medication list and pertinent instructions was printed for patient to review. All questions at time of visit were answered - patient instructed to contact office with any additional concerns or updates. ER/RTC precautions were reviewed with the patient.   Note: Total time spent 30 minutes, greater than 50% of the visit was spent face-to-face counseling and coordinating  care for the above diagnoses listed in assessment/plan.   Please note: voice recognition software was used to produce this document, and typos may escape review. Please contact Dr. Sheppard Coil for any needed clarifications.     Follow-up plan: Return for PENDING CARDIOLOGY EVALUATION .  Gweneth Dimitri, [facility] MS3

## 2020-02-10 ENCOUNTER — Other Ambulatory Visit: Payer: Self-pay

## 2020-02-10 ENCOUNTER — Emergency Department (INDEPENDENT_AMBULATORY_CARE_PROVIDER_SITE_OTHER)
Admission: EM | Admit: 2020-02-10 | Discharge: 2020-02-10 | Disposition: A | Discharge status: Home / Self Care | Payer: BC Managed Care – PPO | Source: Home / Self Care | Attending: Family Medicine | Admitting: Family Medicine

## 2020-02-10 DIAGNOSIS — Q846 Other congenital malformations of nails: Secondary | ICD-10-CM | POA: Diagnosis not present

## 2020-02-10 MED ORDER — DOXYCYCLINE HYCLATE 100 MG PO CAPS
ORAL_CAPSULE | ORAL | 0 refills | Status: DC
Start: 2020-02-10 — End: 2020-05-26

## 2020-02-10 NOTE — ED Triage Notes (Signed)
Patient presents to Urgent Care with complaints of inflammation and swelling to the left middle finger around the finger nail since a few nights ago. Patient reports he didn't think it was a big deal until last night when he felt a painful knot up in his arm medial to his elbow, is worried the infection may be spreading.

## 2020-02-10 NOTE — ED Provider Notes (Signed)
[facility]    CSN: [id number] Arrival date & time: 02/10/20  1850      History   Chief Complaint Chief Complaint  Patient presents with  . Hand Pain  . Arm Pain    HPI Tyler Snyder is a 39 y.o. male.   Patient developed pain/swelling at the base of his left middle fingernail three days ago, becoming worse last night when he discovered a tender subcutaneous nodule near his left elbow.  He recalls no injury, and there has been no drainage from the area.  He denies fevers, chills, and sweats.  The history is provided by the patient.    Past Medical History:  Diagnosis Date  . Hypertension     Patient Active Problem List   Diagnosis Date Noted  . Elevated liver enzymes 02/28/2019  . Essential hypertension 02/14/2019  . BMI 37.0-37.9, adult 02/14/2019  . Abnormal weight gain 02/14/2019  . Chronic fatigue 02/14/2019  . Palpitation 02/14/2019    History reviewed. No pertinent surgical history.     Home Medications    Prior to Admission medications   Medication Sig Start Date End Date Taking? Authorizing Provider  aspirin EC 81 MG tablet Take 1 tablet (81 mg total) by mouth daily. 02/06/20   [name], DO  atorvastatin (LIPITOR) 40 MG tablet Take 1 tablet (40 mg total) by mouth daily. 02/06/20   [name], DO  doxycycline (VIBRAMYCIN) 100 MG capsule Take one cap PO Q12hr with food. 02/10/20   Kandra Nicolas, MD  hydrochlorothiazide (HYDRODIURIL) 12.5 MG tablet Take 2 tablets (25 mg total) by mouth daily. 12/17/19   [name], DO  losartan (COZAAR) 25 MG tablet Take 2 tablets (50 mg total) by mouth daily. 02/06/20 02/05/21  [name], DO    Family History Family History  Problem Relation Age of Onset  . High blood pressure Mother   . Hypertension Mother   . Hyperthyroidism Father   . AAA (abdominal aortic aneurysm) Father     Social History Social History   Tobacco Use  . Smoking status: Never Smoker  .  Smokeless tobacco: Never Used  Vaping Use  . Vaping Use: Never used  Substance Use Topics  . Alcohol use: Never  . Drug use: Never     Allergies   Codeine   Review of Systems Review of Systems  Constitutional: Negative for appetite change, chills, diaphoresis, fatigue and fever.  Skin: Positive for color change.  Hematological: Positive for adenopathy.  All other systems reviewed and are negative.    Physical Exam Triage Vital Signs ED Triage Vitals  Enc Vitals Group     BP 02/10/20 1909 (!) 142/100     Pulse Rate 02/10/20 1909 73     Resp 02/10/20 1909 18     Temp 02/10/20 1909 98.4 F (36.9 C)     Temp Source 02/10/20 1909 Oral     SpO2 02/10/20 1909 100 %     Weight --      Height --      Head Circumference --      Peak Flow --      Pain Score 02/10/20 1907 7     Pain Loc --      Pain Edu? --      Excl. in [place]? --    No data found.  Updated Vital Signs BP (!) 142/100 (BP Location: Right Arm)   Pulse 73   Temp 98.4 F (36.9 C) (Oral)   Resp  18   SpO2 100%   Visual Acuity Right Eye Distance:   Left Eye Distance:   Bilateral Distance:    Right Eye Near:   Left Eye Near:    Bilateral Near:     Physical Exam Vitals and nursing note reviewed.  Constitutional:      General: He is not in acute distress. HENT:     Head: Normocephalic.  Eyes:     Pupils: Pupils are equal, round, and reactive to light.  Cardiovascular:     Rate and Rhythm: Normal rate.  Pulmonary:     Effort: Pulmonary effort is normal.  Musculoskeletal:       Hands:     Comments: Left third finger has erythema, tenderness, and swelling at the base of fingernail.  The skin at base of nail has visible pus underneath.  Skin:    General: Skin is warm.  Neurological:     Mental Status: He is alert.      UC Treatments / Results  Labs (all labs ordered are listed, but only abnormal results are displayed) Labs Reviewed  WOUND CULTURE    EKG   Radiology No results  found.  Procedures Procedures  I and D eponychia.  Risks and benefits of procedure explained to patient and verbal consent obtained.  Using sterile technique, cleansed affected area with Betadine and alcohol.  No anesthesia necessary for procedure. Identified the most fluctuant area of lesion and incised with #[id number] needle.  Expressed purulent fluid. Wound culture taken.  Bandage applied.  Patient tolerated well   Medications Ordered in UC Medications - No data to display  Initial Impression / Assessment and Plan / UC Course  I have reviewed the triage vital signs and the nursing notes.  Pertinent labs & imaging results that were available during my care of the patient were reviewed by me and considered in my medical decision making (see chart for details).    Wound culture pending.  Begin doxycycline for staph coverage. Return for worsening symptoms    Final Clinical Impressions(s) / UC Diagnoses   Final diagnoses:  Eponychia     Discharge Instructions     Elevate hand. Soak finger in warm water 3 to 4 times daily.  Keep wound bandaged.  May take Ibuprofen 200mg , 4 tabs every 8 hours with food.   If symptoms become significantly worse during the night or over the weekend, proceed to the local emergency room.     ED Prescriptions    Medication Sig Dispense Auth. Provider   doxycycline (VIBRAMYCIN) 100 MG capsule Take one cap PO Q12hr with food. 20 capsule Kandra Nicolas, MD        Kandra Nicolas, MD 02/16/20 Lurline Hare

## 2020-02-10 NOTE — Discharge Instructions (Addendum)
Elevate hand. Soak finger in warm water 3 to 4 times daily.  Keep wound bandaged.  May take Ibuprofen 200mg , 4 tabs every 8 hours with food.   If symptoms become significantly worse during the night or over the weekend, proceed to the local emergency room.

## 2020-02-11 ENCOUNTER — Encounter: Payer: Self-pay | Admitting: *Deleted

## 2020-02-12 ENCOUNTER — Other Ambulatory Visit: Payer: Self-pay | Admitting: Osteopathic Medicine

## 2020-02-14 ENCOUNTER — Encounter: Payer: Self-pay | Admitting: Cardiology

## 2020-02-14 ENCOUNTER — Other Ambulatory Visit: Payer: Self-pay

## 2020-02-14 ENCOUNTER — Ambulatory Visit: Payer: BC Managed Care – PPO | Admitting: Cardiology

## 2020-02-14 ENCOUNTER — Telehealth: Payer: Self-pay

## 2020-02-14 VITALS — BP 126/80 | HR 103 | Ht 69.0 in | Wt 252.0 lb

## 2020-02-14 DIAGNOSIS — I10 Essential (primary) hypertension: Secondary | ICD-10-CM | POA: Diagnosis not present

## 2020-02-14 DIAGNOSIS — E785 Hyperlipidemia, unspecified: Secondary | ICD-10-CM

## 2020-02-14 DIAGNOSIS — R5383 Other fatigue: Secondary | ICD-10-CM | POA: Diagnosis not present

## 2020-02-14 DIAGNOSIS — R072 Precordial pain: Secondary | ICD-10-CM | POA: Diagnosis not present

## 2020-02-14 DIAGNOSIS — R0600 Dyspnea, unspecified: Secondary | ICD-10-CM | POA: Diagnosis not present

## 2020-02-14 DIAGNOSIS — R0609 Other forms of dyspnea: Secondary | ICD-10-CM | POA: Insufficient documentation

## 2020-02-14 DIAGNOSIS — Z6837 Body mass index (BMI) 37.0-37.9, adult: Secondary | ICD-10-CM | POA: Diagnosis not present

## 2020-02-14 MED ORDER — NITROGLYCERIN 0.4 MG SL SUBL: 0.4000 mg | SUBLINGUAL_TABLET | SUBLINGUAL | 6 refills | Status: AC | PRN

## 2020-02-14 MED ORDER — METOPROLOL SUCCINATE ER 25 MG PO TB24
12.5000 mg | ORAL_TABLET | Freq: Every day | ORAL | 2 refills | Status: DC
Start: 2020-02-14 — End: 2020-11-17

## 2020-02-14 NOTE — Patient Instructions (Addendum)
Medication Instructions:  Your physician has recommended you make the following change in your medication:   Take Nitroglycerin as needed for chest pain. Star Toprol 12.5 mg daily.  *If you need a refill on your cardiac medications before your next appointment, please call your pharmacy*   Lab Work: Your physician recommends that you return for lab work in: 1 week before your CT scan if it is after 03/09/20.  If you have labs (blood work) drawn today and your tests are completely normal, you will receive your results only by: Marland Kitchen MyChart Message (if you have MyChart) OR . A paper copy in the mail If you have any lab test that is abnormal or we need to change your treatment, we will call you to review the results.   Testing/Procedures: Your cardiac CT will be scheduled at:   [facility] [place],  [id number] [phone]   If scheduled at [place], please arrive at the [facility] main entrance of [facility] 30 minutes prior to test start time. Proceed to the [facility] Radiology Department (first floor) to check-in and test prep.  Please follow these instructions carefully (unless otherwise directed):  Hold all erectile dysfunction medications at least 3 days (72 hrs) prior to test.  On the Night Before the Test: . Be sure to Drink plenty of water. . Do not consume any caffeinated/decaffeinated beverages or chocolate 12 hours prior to your test. . Do not take any antihistamines 12 hours prior to your test.   On the Day of the Test: . Drink plenty of water. Do not drink any water within one hour of the test. . Do not eat any food 4 hours prior to the test. . You may take your regular medications prior to the test.  . Take metoprolol (Lopressor) two hours prior to test.     After the Test: . Drink plenty of water. . After receiving IV contrast, you may experience a mild flushed feeling. This is normal. . On occasion, you  may experience a mild rash up to 24 hours after the test. This is not dangerous. If this occurs, you can take Benadryl 25 mg and increase your fluid intake. . If you experience trouble breathing, this can be serious. If it is severe call 911 IMMEDIATELY. If it is mild, please call our office. . If you take any of these medications: Glipizide/Metformin, Avandament, Glucavance, please do not take 48 hours after completing test unless otherwise instructed.   Once we have confirmed authorization from your insurance company, we will call you to set up a date and time for your test. Based on how quickly your insurance processes prior authorizations requests, please allow up to 4 weeks to be contacted for scheduling your Cardiac CT appointment. Be advised that routine Cardiac CT appointments could be scheduled as many as 8 weeks after your provider has ordered it.  For non-scheduling related questions, please contact the cardiac imaging nurse navigator should you have any questions/concerns: Marchia Bond, Cardiac Imaging Nurse Navigator Burley Saver, Interim Cardiac Imaging Nurse [place] [facility] Direct Office Dial: [phone]   For scheduling needs, including cancellations and rescheduling, please call Vivien Rota at [phone].    Your physician has requested that you have an echocardiogram. Echocardiography is a painless test that uses sound waves to create images of your heart. It provides your doctor with information about the size and shape of your heart and how well  your heart's chambers and valves are working. This procedure takes approximately one hour. There are no restrictions for this procedure.     Follow-Up: At [facility], you and your health needs are our priority.  As part of our continuing mission to provide you with exceptional heart care, we have created designated Provider Care Teams.  These Care Teams include your primary Cardiologist (physician) and  Advanced Practice Providers (APPs -  Physician Assistants and Nurse Practitioners) who all work together to provide you with the care you need, when you need it.  We recommend signing up for the patient portal called "MyChart".  Sign up information is provided on this After Visit Summary.  MyChart is used to connect with patients for Virtual Visits (Telemedicine).  Patients are able to view lab/test results, encounter notes, upcoming appointments, etc.  Non-urgent messages can be sent to your provider as well.   To learn more about what you can do with MyChart, go to [email or web address].    Your next appointment:   3 month(s)  The format for your next appointment:   In Person  Provider:   Berniece Salines, DO   Other Instructions Cardiac CT Angiogram A cardiac CT angiogram is a procedure to look at the heart and the area around the heart. It may be done to help find the cause of chest pains or other symptoms of heart disease. During this procedure, a substance called contrast dye is injected into the blood vessels in the area to be checked. A large X-ray machine, called a CT scanner, then takes detailed pictures of the heart and the surrounding area. The procedure is also sometimes called a coronary CT angiogram, coronary artery scanning, or CTA. A cardiac CT angiogram allows the health care provider to see how well blood is flowing to and from the heart. The health care provider will be able to see if there are any problems, such as:  Blockage or narrowing of the coronary arteries in the heart.  Fluid around the heart.  Signs of weakness or disease in the muscles, valves, and tissues of the heart. Tell a health care provider about:  Any allergies you have. This is especially important if you have had a previous allergic reaction to contrast dye.  All medicines you are taking, including vitamins, herbs, eye drops, creams, and over-the-counter medicines.  Any blood disorders you  have.  Any surgeries you have had.  Any medical conditions you have.  Whether you are pregnant or may be pregnant.  Any anxiety disorders, chronic pain, or other conditions you have that may increase your stress or prevent you from lying still. What are the risks? Generally, this is a safe procedure. However, problems may occur, including: 1. Bleeding. 2. Infection. 3. Allergic reactions to medicines or dyes. 4. Damage to other structures or organs. 5. Kidney damage from the contrast dye that is used. 6. Increased risk of cancer from radiation exposure. This risk is low. Talk with your health care provider about: ? The risks and benefits of testing. ? How you can receive the lowest dose of radiation. What happens before the procedure? 1. Wear comfortable clothing and remove any jewelry, glasses, dentures, and hearing aids. 2. Follow instructions from your health care provider about eating and drinking. This may include: ? For 12 hours before the procedure -- avoid caffeine. This includes tea, coffee, soda, energy drinks, and diet pills. Drink plenty of water or other fluids that do not have caffeine in them. Being well  hydrated can prevent complications. ? For 4-6 hours before the procedure -- stop eating and drinking. The contrast dye can cause nausea, but this is less likely if your stomach is empty. 3. Ask your health care provider about changing or stopping your regular medicines. This is especially important if you are taking diabetes medicines, blood thinners, or medicines to treat problems with erections (erectile dysfunction). What happens during the procedure?  1. Hair on your chest may need to be removed so that small sticky patches called electrodes can be placed on your chest. These will transmit information that helps to monitor your heart during the procedure. 2. An IV will be inserted into one of your veins. 3. You might be given a medicine to control your heart rate  during the procedure. This will help to ensure that good images are obtained. 4. You will be asked to lie on an exam table. This table will slide in and out of the CT machine during the procedure. 5. Contrast dye will be injected into the IV. You might feel warm, or you may get a metallic taste in your mouth. 6. You will be given a medicine called nitroglycerin. This will relax or dilate the arteries in your heart. 7. The table that you are lying on will move into the CT machine tunnel for the scan. 8. The person running the machine will give you instructions while the scans are being done. You may be asked to: ? Keep your arms above your head. ? Hold your breath. ? Stay very still, even if the table is moving. 9. When the scanning is complete, you will be moved out of the machine. 10. The IV will be removed. The procedure may vary among health care providers and hospitals. What can I expect after the procedure? After your procedure, it is common to have:  A metallic taste in your mouth from the contrast dye.  A feeling of warmth.  A headache from the nitroglycerin. Follow these instructions at home:  Take over-the-counter and prescription medicines only as told by your health care provider.  If you are told, drink enough fluid to keep your urine pale yellow. This will help to flush the contrast dye out of your body.  Most people can return to their normal activities right after the procedure. Ask your health care provider what activities are safe for you.  It is up to you to get the results of your procedure. Ask your health care provider, or the department that is doing the procedure, when your results will be ready.  Keep all follow-up visits as told by your health care provider. This is important. Contact a health care provider if: 1. You have any symptoms of allergy to the contrast dye. These include: ? Shortness of breath. ? Rash or hives. ? A racing heartbeat. Summary  A  cardiac CT angiogram is a procedure to look at the heart and the area around the heart. It may be done to help find the cause of chest pains or other symptoms of heart disease.  During this procedure, a large X-ray machine, called a CT scanner, takes detailed pictures of the heart and the surrounding area after a contrast dye has been injected into blood vessels in the area.  Ask your health care provider about changing or stopping your regular medicines before the procedure. This is especially important if you are taking diabetes medicines, blood thinners, or medicines to treat erectile dysfunction.  If you are told, drink  enough fluid to keep your urine pale yellow. This will help to flush the contrast dye out of your body. This information is not intended to replace advice given to you by your health care provider. Make sure you discuss any questions you have with your health care provider. Document Revised: 02/06/2019 Document Reviewed: 02/06/2019 Elsevier Patient Education  [place].   Echocardiogram An echocardiogram is a procedure that uses painless sound waves (ultrasound) to produce an image of the heart. Images from an echocardiogram can provide important information about:  Signs of coronary artery disease (CAD).  Aneurysm detection. An aneurysm is a weak or damaged part of an artery wall that bulges out from the normal force of blood pumping through the body.  Heart size and shape. Changes in the size or shape of the heart can be associated with certain conditions, including heart failure, aneurysm, and CAD.  Heart muscle function.  Heart valve function.  Signs of a past heart attack.  Fluid buildup around the heart.  Thickening of the heart muscle.  A tumor or infectious growth around the heart valves. Tell a health care provider about:  Any allergies you have.  All medicines you are taking, including vitamins, herbs, eye drops, creams, and over-the-counter  medicines.  Any blood disorders you have.  Any surgeries you have had.  Any medical conditions you have.  Whether you are pregnant or may be pregnant. What are the risks? Generally, this is a safe procedure. However, problems may occur, including:  Allergic reaction to dye (contrast) that may be used during the procedure. What happens before the procedure? No specific preparation is needed. You may eat and drink normally. What happens during the procedure?   An IV tube may be inserted into one of your veins.  You may receive contrast through this tube. A contrast is an injection that improves the quality of the pictures from your heart.  A gel will be applied to your chest.  A wand-like tool (transducer) will be moved over your chest. The gel will help to transmit the sound waves from the transducer.  The sound waves will harmlessly bounce off of your heart to allow the heart images to be captured in real-time motion. The images will be recorded on a computer. The procedure may vary among health care providers and hospitals. What happens after the procedure?  You may return to your normal, everyday life, including diet, activities, and medicines, unless your health care provider tells you not to do that. Summary  An echocardiogram is a procedure that uses painless sound waves (ultrasound) to produce an image of the heart.  Images from an echocardiogram can provide important information about the size and shape of your heart, heart muscle function, heart valve function, and fluid buildup around your heart.  You do not need to do anything to prepare before this procedure. You may eat and drink normally.  After the echocardiogram is completed, you may return to your normal, everyday life, unless your health care provider tells you not to do that. This information is not intended to replace advice given to you by your health care provider. Make sure you discuss any questions you  have with your health care provider. Document Revised: 10/04/2018 Document Reviewed: 07/16/2016 Elsevier Patient Education  [place].  Nitroglycerin sublingual tablets What is this medicine? NITROGLYCERIN (nye troe GLI ser in) is a type of vasodilator. It relaxes blood vessels, increasing the blood and oxygen supply to your heart. This  medicine is used to relieve chest pain caused by angina. It is also used to prevent chest pain before activities like climbing stairs, going outdoors in cold weather, or sexual activity. This medicine may be used for other purposes; ask your health care provider or pharmacist if you have questions. COMMON BRAND NAME(S): Nitroquick, Nitrostat, Nitrotab What should I tell my health care provider before I take this medicine? They need to know if you have any of these conditions:  anemia  head injury, recent stroke, or bleeding in the brain  liver disease  previous heart attack  an unusual or allergic reaction to nitroglycerin, other medicines, foods, dyes, or preservatives  pregnant or trying to get pregnant  breast-feeding How should I use this medicine? Take this medicine by mouth as needed. At the first sign of an angina attack (chest pain or tightness) place one tablet under your tongue. You can also take this medicine 5 to 10 minutes before an event likely to produce chest pain. Follow the directions on the prescription label. Let the tablet dissolve under the tongue. Do not swallow whole. Replace the dose if you accidentally swallow it. It will help if your mouth is not dry. Saliva around the tablet will help it to dissolve more quickly. Do not eat or drink, smoke or chew tobacco while a tablet is dissolving. If you are not better within 5 minutes after taking ONE dose of nitroglycerin, call 9-1-1 immediately to seek emergency medical care. Do not take more than 3 nitroglycerin tablets over 15 minutes. If you take this medicine often to relieve  symptoms of angina, your doctor or health care professional may provide you with different instructions to manage your symptoms. If symptoms do not go away after following these instructions, it is important to call 9-1-1 immediately. Do not take more than 3 nitroglycerin tablets over 15 minutes. Talk to your pediatrician regarding the use of this medicine in children. Special care may be needed. Overdosage: If you think you have taken too much of this medicine contact a poison control center or emergency room at once. NOTE: This medicine is only for you. Do not share this medicine with others. What if I miss a dose? This does not apply. This medicine is only used as needed. What may interact with this medicine? Do not take this medicine with any of the following medications:  certain migraine medicines like ergotamine and dihydroergotamine (DHE)  medicines used to treat erectile dysfunction like sildenafil, tadalafil, and vardenafil  riociguat This medicine may also interact with the following medications:  alteplase  aspirin  heparin  medicines for high blood pressure  medicines for mental depression  other medicines used to treat angina  phenothiazines like chlorpromazine, mesoridazine, prochlorperazine, thioridazine This list may not describe all possible interactions. Give your health care provider a list of all the medicines, herbs, non-prescription drugs, or dietary supplements you use. Also tell them if you smoke, drink alcohol, or use illegal drugs. Some items may interact with your medicine. What should I watch for while using this medicine? Tell your doctor or health care professional if you feel your medicine is no longer working. Keep this medicine with you at all times. Sit or lie down when you take your medicine to prevent falling if you feel dizzy or faint after using it. Try to remain calm. This will help you to feel better faster. If you feel dizzy, take several deep  breaths and lie down with your feet propped up, or  bend forward with your head resting between your knees. You may get drowsy or dizzy. Do not drive, use machinery, or do anything that needs mental alertness until you know how this drug affects you. Do not stand or sit up quickly, especially if you are an older patient. This reduces the risk of dizzy or fainting spells. Alcohol can make you more drowsy and dizzy. Avoid alcoholic drinks. Do not treat yourself for coughs, colds, or pain while you are taking this medicine without asking your doctor or health care professional for advice. Some ingredients may increase your blood pressure. What side effects may I notice from receiving this medicine? Side effects that you should report to your doctor or health care professional as soon as possible:  blurred vision  dry mouth  skin rash  sweating  the feeling of extreme pressure in the head  unusually weak or tired Side effects that usually do not require medical attention (report to your doctor or health care professional if they continue or are bothersome):  flushing of the face or neck  headache  irregular heartbeat, palpitations  nausea, vomiting This list may not describe all possible side effects. Call your doctor for medical advice about side effects. You may report side effects to FDA at 1-800-FDA-1088. Where should I keep my medicine? Keep out of the reach of children. Store at room temperature between 20 and 25 degrees C (68 and 77 degrees F). Store in [facility]. Protect from light and moisture. Keep tightly closed. Throw away any unused medicine after the expiration date. NOTE: This sheet is a summary. It may not cover all possible information. If you have questions about this medicine, talk to your doctor, pharmacist, or health care provider.  2020 Elsevier/Gold Standard (2013-04-11 17:57:36)  Metoprolol Extended-Release Tablets What is this medicine? METOPROLOL (me  TOE proe lole) is a beta blocker. It decreases the amount of work your heart has to do and helps your heart beat regularly. It treats high blood pressure and/or prevent chest pain (also called angina). It also treats heart failure. This medicine may be used for other purposes; ask your health care provider or pharmacist if you have questions. COMMON BRAND NAME(S): toprol, Toprol XL What should I tell my health care provider before I take this medicine? They need to know if you have any of these conditions:  diabetes  heart or vessel disease like slow heart rate, worsening heart failure, heart block, sick sinus syndrome or Raynaud's disease  kidney disease  liver disease  lung or breathing disease, like asthma or emphysema  pheochromocytoma  thyroid disease  an unusual or allergic reaction to metoprolol, other beta-blockers, medicines, foods, dyes, or preservatives  pregnant or trying to get pregnant  breast-feeding How should I use this medicine? Take this drug by mouth. Take it as directed on the prescription label at the same time every day. Take it with food. You may cut the tablet in half if it is scored (has a line in the middle of it). This may help you swallow the tablet if the whole tablet is too big. Be sure to take both halves. Do not take just one-half of the tablet. Keep taking it unless your health care provider tells you to stop. Talk to your health care provider about the use of this drug in children. While it may be prescribed for children as young as 6 for selected conditions, precautions do apply. Overdosage: If you think you have taken too much of this  medicine contact a poison control center or emergency room at once. NOTE: This medicine is only for you. Do not share this medicine with others. What if I miss a dose? If you miss a dose, take it as soon as you can. If it is almost time for your next dose, take only that dose. Do not take double or extra doses. What  may interact with this medicine? This medicine may interact with the following medications:  certain medicines for blood pressure, heart disease, irregular heart beat  certain medicines for depression, like monoamine oxidase (MAO) inhibitors, fluoxetine, or paroxetine  clonidine  dobutamine  epinephrine  isoproterenol  reserpine This list may not describe all possible interactions. Give your health care provider a list of all the medicines, herbs, non-prescription drugs, or dietary supplements you use. Also tell them if you smoke, drink alcohol, or use illegal drugs. Some items may interact with your medicine. What should I watch for while using this medicine? Visit your doctor or health care professional for regular check ups. Contact your doctor right away if your symptoms worsen. Check your blood pressure and pulse rate regularly. Ask your health care professional what your blood pressure and pulse rate should be, and when you should contact them. You may get drowsy or dizzy. Do not drive, use machinery, or do anything that needs mental alertness until you know how this medicine affects you. Do not sit or stand up quickly, especially if you are an older patient. This reduces the risk of dizzy or fainting spells. Contact your doctor if these symptoms continue. Alcohol may interfere with the effect of this medicine. Avoid alcoholic drinks. This medicine may increase blood sugar. Ask your healthcare provider if changes in diet or medicines are needed if you have diabetes. What side effects may I notice from receiving this medicine? Side effects that you should report to your doctor or health care professional as soon as possible:  allergic reactions like skin rash, itching or hives  cold or numb hands or feet  depression  difficulty breathing  faint  fever with sore throat  irregular heartbeat, chest pain  rapid weight gain   signs and symptoms of high blood sugar such as  being more thirsty or hungry or having to urinate more than normal. You may also feel very tired or have blurry vision.  swollen legs or ankles Side effects that usually do not require medical attention (report to your doctor or health care professional if they continue or are bothersome):  anxiety or nervousness  change in sex drive or performance  dry skin  headache  nightmares or trouble sleeping  short term memory loss  stomach upset or diarrhea This list may not describe all possible side effects. Call your doctor for medical advice about side effects. You may report side effects to FDA at 1-800-FDA-1088. Where should I keep my medicine? Keep out of the reach of children and pets. Store at room temperature between 20 and 25 degrees C (68 and 77 degrees F). Throw away any unused drug after the expiration date. NOTE: This sheet is a summary. It may not cover all possible information. If you have questions about this medicine, talk to your doctor, pharmacist, or health care provider.  2020 [employer] (2019-01-24 18:23:00)

## 2020-02-14 NOTE — Telephone Encounter (Signed)
Tyler Snyder called and states his blood pressure medication, Losartan was increased to 50 mg daily. A change in the prescription was not sent in. Please advise.

## 2020-02-14 NOTE — Progress Notes (Signed)
Cardiology Office Note:    Date:  02/14/2020   ID:  Tyler Snyder, DOB Feb 25, 1981, MRN [id number]  PCP:  [name], DO  Cardiologist:  Berniece Salines, DO  Electrophysiologist:  None   Referring MD: [name], DO   Chief Complaint  Patient presents with  . Chest Pain  . Abnormal ECG   History of Present Illness:    Tyler Snyder is a 39 y.o. male with a hx of hypertension, hyperlipidemia, obesity presents today to be evaluated for chest pain.  Patient tells me that over the last several months he has been experiencing worsening chest pain on exertion.  He notes that usually left-sided if you pressure-like at times and then radiates diffusely.  The patient notes that this happens mostly when he is walking.  And when he sits down to rest it gets better.  He is concerned because of significant premature coronary artery disease in his family members.  In addition he is having intermittent palpitations as well which every time is associated with elevated blood pressure.  Patient reports to me he had COVID-19 infection and had a mild case recovered well with no problems.  But since that time he has had a great deal of fatigue and shortness of breath.    He presented to the emergency department on January 28, 2020 due to chest pain.  At that time he was treated his troponin was negative due to abnormal EKG and elevated blood pressure he was asked to see his PCP.  With the visit with his PCP recommended that he see cardiology.  He was started on losartan 25 mg when he was seen in the emergency room which was added to his hydrochlorothiazide.  Today here in the office the patient tells me he takes losartan 25 mg daily (2 tablets daily) and hydrochlorothiazide 25 mg daily.  Past Medical History:  Diagnosis Date  . Abnormal weight gain 02/14/2019  . Acute bacterial conjunctivitis of right eye 01/16/2018  . Acute non-recurrent maxillary sinusitis 01/16/2018  . Chronic fatigue  02/14/2019  . Elevated liver enzymes 02/28/2019  . Essential hypertension 02/14/2019  . Hypertension   . Palpitation 02/14/2019  . Sorethroat 01/16/2018    Past Surgical History:  Procedure Laterality Date  . WISDOM TOOTH EXTRACTION      Current Medications: Current Meds  Medication Sig  . aspirin EC 81 MG tablet Take 1 tablet (81 mg total) by mouth daily.  Marland Kitchen atorvastatin (LIPITOR) 40 MG tablet Take 1 tablet (40 mg total) by mouth daily.  Marland Kitchen doxycycline (VIBRAMYCIN) 100 MG capsule Take one cap PO Q12hr with food.  . hydrochlorothiazide (HYDRODIURIL) 12.5 MG tablet Take 2 tablets (25 mg total) by mouth daily.  Marland Kitchen losartan (COZAAR) 25 MG tablet Take 2 tablets (50 mg total) by mouth daily.     Allergies:   Codeine   Social History   Socioeconomic History  . Marital status: Divorced    Spouse name: Not on file  . Number of children: Not on file  . Years of education: Not on file  . Highest education level: Not on file  Occupational History  . Not on file  Tobacco Use  . Smoking status: Never Smoker  . Smokeless tobacco: Never Used  Vaping Use  . Vaping Use: Never used  Substance and Sexual Activity  . Alcohol use: Never  . Drug use: Never  . Sexual activity: Yes    Partners: Female    Birth control/protection: Condom  Other Topics Concern  .  Not on file  Social History Narrative  . Not on file   Social Determinants of Health   Financial Resource Strain:   . Difficulty of Paying Living Expenses: Not on file  Food Insecurity:   . Worried About Charity fundraiser in the Last Year: Not on file  . Ran Out of Food in the Last Year: Not on file  Transportation Needs:   . Lack of Transportation (Medical): Not on file  . Lack of Transportation (Non-Medical): Not on file  Physical Activity:   . Days of Exercise per Week: Not on file  . Minutes of Exercise per Session: Not on file  Stress:   . Feeling of Stress : Not on file  Social Connections:   . Frequency of  Communication with Friends and Family: Not on file  . Frequency of Social Gatherings with Friends and Family: Not on file  . Attends Religious Services: Not on file  . Active Member of Clubs or Organizations: Not on file  . Attends Archivist Meetings: Not on file  . Marital Status: Not on file     Family History: The patient's family history includes Heart disease in his paternal uncle, paternal uncle, paternal uncle, and paternal uncle; High blood pressure in his mother; Hypertension in his mother; Hyperthyroidism in his father; Rheum arthritis in his mother.  ROS:   Review of Systems  Constitution: Negative for decreased appetite, fever and weight gain.  HENT: Negative for congestion, ear discharge, hoarse voice and sore throat.   Eyes: Negative for discharge, redness, vision loss in right eye and visual halos.  Cardiovascular: Negative for chest pain, dyspnea on exertion, leg swelling, orthopnea and palpitations.  Respiratory: Negative for cough, hemoptysis, shortness of breath and snoring.   Endocrine: Negative for heat intolerance and polyphagia.  Hematologic/Lymphatic: Negative for bleeding problem. Does not bruise/bleed easily.  Skin: Negative for flushing, nail changes, rash and suspicious lesions.  Musculoskeletal: Negative for arthritis, joint pain, muscle cramps, myalgias, neck pain and stiffness.  Gastrointestinal: Negative for abdominal pain, bowel incontinence, diarrhea and excessive appetite.  Genitourinary: Negative for decreased libido, genital sores and incomplete emptying.  Neurological: Negative for brief paralysis, focal weakness, headaches and loss of balance.  Psychiatric/Behavioral: Negative for altered mental status, depression and suicidal ideas.  Allergic/Immunologic: Negative for HIV exposure and persistent infections.    EKGs/Labs/Other Studies Reviewed:    The following studies were reviewed today:   EKG:  The ekg ordered today demonstrates  sinus tachycardia, heart rate 103 bpm T wave inversion which is nonspecific.  Recent Labs: 02/19/2019: Hemoglobin 13.9; Platelets 214; TSH 1.29 01/31/2020: ALT 55; BUN 13; Creat 0.79; Potassium 4.1; Sodium 138  Recent Lipid Panel    Component Value Date/Time   CHOL 181 01/31/2020 0000   TRIG 103 01/31/2020 0000   HDL 30 (L) 01/31/2020 0000   CHOLHDL 6.0 (H) 01/31/2020 0000   LDLCALC 130 (H) 01/31/2020 0000    Physical Exam:    VS:  BP 126/80 (BP Location: Left Arm, Patient Position: Sitting, Cuff Size: Large)   Pulse (!) 103   Ht '5\' 9"'  (1.753 m)   Wt 252 lb (114.3 kg)   SpO2 98%   BMI 37.21 kg/m     Wt Readings from Last 3 Encounters:  02/14/20 252 lb (114.3 kg)  02/06/20 253 lb (114.8 kg)  03/14/19 246 lb 8 oz (111.8 kg)     GEN: Well nourished, well developed in no acute distress HEENT: Normal NECK: No  JVD; No carotid bruits LYMPHATICS: No lymphadenopathy CARDIAC: S1S2 noted,RRR, no murmurs, rubs, gallops RESPIRATORY:  Clear to auscultation without rales, wheezing or rhonchi  ABDOMEN: Soft, non-tender, non-distended, +bowel sounds, no guarding. EXTREMITIES: No edema, No cyanosis, no clubbing MUSCULOSKELETAL:  No deformity  SKIN: Warm and dry NEUROLOGIC:  Alert and oriented x 3, non-focal PSYCHIATRIC:  Normal affect, good insight  ASSESSMENT:    1. Essential hypertension   2. BMI 37.0-37.9, adult   3. Other fatigue   4. Dyspnea on exertion   5. Precordial chest pain   6. Dyslipidemia (high LDL; low HDL)   7. Precordial pain    PLAN:     His chest pain is concerning given his risk factor hypertension, hyperlipidemia obesity and significant premature coronary artery disease in family members with most people dying before the age of 51.  I like to proceed with ischemic evaluation in this patient.  Coronary CT will be appropriate at this time.  Patient does not have any IV contrast dye allergy.  He is willing to proceed with his testing.  Sublingual nitroglycerin  prescription was sent, its protocol and 911 protocol explained and the patient vocalized understanding questions were answered to the patient's satisfaction.  I reviewed his lab work which was done in August which showed evidence of dyslipidemia (HDL 31, LDL 130) he will stay on his atorvastatin 40 mg for now.   I will start patient on low-dose Toprol-XL 12.5 mg daily.  In addition to his losartan as well as his hydrochlorothiazide.  I am hoping this helps with the palpitation and also help keep the patient blood pressure at goal of less than 130/80 mmHg.  An echocardiogram will also be ordered to assess LV function and any other structural valvular disease in the setting of his shortness of breath and fatigue.  The patient understands the need to lose weight with diet and exercise. We have discussed specific strategies for this.  The patient is in agreement with the above plan. The patient left the office in stable condition.  The patient will follow up in 3 months or sooner if needed.   Medication Adjustments/Labs and Tests Ordered: Current medicines are reviewed at length with the patient today.  Concerns regarding medicines are outlined above.  Orders Placed This Encounter  Procedures  . CT CORONARY MORPH W/CTA COR W/SCORE W/CA W/CM &/OR WO/CM  . CT CORONARY FRACTIONAL FLOW RESERVE DATA PREP  . CT CORONARY FRACTIONAL FLOW RESERVE FLUID ANALYSIS  . EKG 12-Lead  . ECHOCARDIOGRAM COMPLETE   Meds ordered this encounter  Medications  . nitroGLYCERIN (NITROSTAT) 0.4 MG SL tablet    Sig: Place 1 tablet (0.4 mg total) under the tongue every 5 (five) minutes as needed.    Dispense:  25 tablet    Refill:  6  . metoprolol succinate (TOPROL XL) 25 MG 24 hr tablet    Sig: Take 0.5 tablets (12.5 mg total) by mouth daily.    Dispense:  45 tablet    Refill:  2    Patient Instructions  Medication Instructions:  Your physician has recommended you make the following change in your medication:     Take Nitroglycerin as needed for chest pain. Star Toprol 12.5 mg daily.  *If you need a refill on your cardiac medications before your next appointment, please call your pharmacy*   Lab Work: Your physician recommends that you return for lab work in: 1 week before your CT scan if it is after 03/09/20.  If  you have labs (blood work) drawn today and your tests are completely normal, you will receive your results only by: Marland Kitchen MyChart Message (if you have MyChart) OR . A paper copy in the mail If you have any lab test that is abnormal or we need to change your treatment, we will call you to review the results.   Testing/Procedures: Your cardiac CT will be scheduled at:   [facility], [place] [address] [phone]   If scheduled at [facility], please arrive at the [facility] main entrance of [facility] 30 minutes prior to test start time. Proceed to the [facility] Radiology Department (first floor) to check-in and test prep.  Please follow these instructions carefully (unless otherwise directed):  Hold all erectile dysfunction medications at least 3 days (72 hrs) prior to test.  On the Night Before the Test: . Be sure to Drink plenty of water. . Do not consume any caffeinated/decaffeinated beverages or chocolate 12 hours prior to your test. . Do not take any antihistamines 12 hours prior to your test.   On the Day of the Test: . Drink plenty of water. Do not drink any water within one hour of the test. . Do not eat any food 4 hours prior to the test. . You may take your regular medications prior to the test.  . Take metoprolol (Lopressor) two hours prior to test.     After the Test: . Drink plenty of water. . After receiving IV contrast, you may experience a mild flushed feeling. This is normal. . On occasion, you may experience a mild rash up to 24 hours after the test. This is not dangerous. If this occurs, you can  take Benadryl 25 mg and increase your fluid intake. . If you experience trouble breathing, this can be serious. If it is severe call 911 IMMEDIATELY. If it is mild, please call our office. . If you take any of these medications: Glipizide/Metformin, Avandament, Glucavance, please do not take 48 hours after completing test unless otherwise instructed.   Once we have confirmed authorization from your insurance company, we will call you to set up a date and time for your test. Based on how quickly your insurance processes prior authorizations requests, please allow up to 4 weeks to be contacted for scheduling your Cardiac CT appointment. Be advised that routine Cardiac CT appointments could be scheduled as many as 8 weeks after your provider has ordered it.  For non-scheduling related questions, please contact the cardiac imaging nurse navigator should you have any questions/concerns: Marchia Bond, Cardiac Imaging Nurse Navigator Burley Saver, Interim Cardiac Imaging Nurse [place] [facility] Direct Office Dial: [phone]   For scheduling needs, including cancellations and rescheduling, please call Vivien Rota at [phone].    Your physician has requested that you have an echocardiogram. Echocardiography is a painless test that uses sound waves to create images of your heart. It provides your doctor with information about the size and shape of your heart and how well your heart's chambers and valves are working. This procedure takes approximately one hour. There are no restrictions for this procedure.     Follow-Up: At [facility], you and your health needs are our priority.  As part of our continuing mission to provide you with exceptional heart care, we have created designated Provider Care Teams.  These Care Teams include your primary Cardiologist (physician) and Advanced Practice Providers (APPs -  Physician  Assistants and Nurse Practitioners) who all work together  to provide you with the care you need, when you need it.  We recommend signing up for the patient portal called "MyChart".  Sign up information is provided on this After Visit Summary.  MyChart is used to connect with patients for Virtual Visits (Telemedicine).  Patients are able to view lab/test results, encounter notes, upcoming appointments, etc.  Non-urgent messages can be sent to your provider as well.   To learn more about what you can do with MyChart, go to [email or web address].    Your next appointment:   3 month(s)  The format for your next appointment:   In Person  Provider:   Berniece Salines, DO   Other Instructions Cardiac CT Angiogram A cardiac CT angiogram is a procedure to look at the heart and the area around the heart. It may be done to help find the cause of chest pains or other symptoms of heart disease. During this procedure, a substance called contrast dye is injected into the blood vessels in the area to be checked. A large X-ray machine, called a CT scanner, then takes detailed pictures of the heart and the surrounding area. The procedure is also sometimes called a coronary CT angiogram, coronary artery scanning, or CTA. A cardiac CT angiogram allows the health care provider to see how well blood is flowing to and from the heart. The health care provider will be able to see if there are any problems, such as:  Blockage or narrowing of the coronary arteries in the heart.  Fluid around the heart.  Signs of weakness or disease in the muscles, valves, and tissues of the heart. Tell a health care provider about:  Any allergies you have. This is especially important if you have had a previous allergic reaction to contrast dye.  All medicines you are taking, including vitamins, herbs, eye drops, creams, and over-the-counter medicines.  Any blood disorders you have.  Any surgeries you have had.  Any medical conditions you have.  Whether you are pregnant or may be  pregnant.  Any anxiety disorders, chronic pain, or other conditions you have that may increase your stress or prevent you from lying still. What are the risks? Generally, this is a safe procedure. However, problems may occur, including: 1. Bleeding. 2. Infection. 3. Allergic reactions to medicines or dyes. 4. Damage to other structures or organs. 5. Kidney damage from the contrast dye that is used. 6. Increased risk of cancer from radiation exposure. This risk is low. Talk with your health care provider about: ? The risks and benefits of testing. ? How you can receive the lowest dose of radiation. What happens before the procedure? 1. Wear comfortable clothing and remove any jewelry, glasses, dentures, and hearing aids. 2. Follow instructions from your health care provider about eating and drinking. This may include: ? For 12 hours before the procedure -- avoid caffeine. This includes tea, coffee, soda, energy drinks, and diet pills. Drink plenty of water or other fluids that do not have caffeine in them. Being well hydrated can prevent complications. ? For 4-6 hours before the procedure -- stop eating and drinking. The contrast dye can cause nausea, but this is less likely if your stomach is empty. 3. Ask your health care provider about changing or stopping your regular medicines. This is especially important if you are taking diabetes medicines, blood thinners, or medicines to treat problems with erections (erectile dysfunction). What happens during the procedure?  1. Hair  on your chest may need to be removed so that small sticky patches called electrodes can be placed on your chest. These will transmit information that helps to monitor your heart during the procedure. 2. An IV will be inserted into one of your veins. 3. You might be given a medicine to control your heart rate during the procedure. This will help to ensure that good images are obtained. 4. You will be asked to lie on an  exam table. This table will slide in and out of the CT machine during the procedure. 5. Contrast dye will be injected into the IV. You might feel warm, or you may get a metallic taste in your mouth. 6. You will be given a medicine called nitroglycerin. This will relax or dilate the arteries in your heart. 7. The table that you are lying on will move into the CT machine tunnel for the scan. 8. The person running the machine will give you instructions while the scans are being done. You may be asked to: ? Keep your arms above your head. ? Hold your breath. ? Stay very still, even if the table is moving. 9. When the scanning is complete, you will be moved out of the machine. 10. The IV will be removed. The procedure may vary among health care providers and hospitals. What can I expect after the procedure? After your procedure, it is common to have:  A metallic taste in your mouth from the contrast dye.  A feeling of warmth.  A headache from the nitroglycerin. Follow these instructions at home:  Take over-the-counter and prescription medicines only as told by your health care provider.  If you are told, drink enough fluid to keep your urine pale yellow. This will help to flush the contrast dye out of your body.  Most people can return to their normal activities right after the procedure. Ask your health care provider what activities are safe for you.  It is up to you to get the results of your procedure. Ask your health care provider, or the department that is doing the procedure, when your results will be ready.  Keep all follow-up visits as told by your health care provider. This is important. Contact a health care provider if: 1. You have any symptoms of allergy to the contrast dye. These include: ? Shortness of breath. ? Rash or hives. ? A racing heartbeat. Summary  A cardiac CT angiogram is a procedure to look at the heart and the area around the heart. It may be done to help  find the cause of chest pains or other symptoms of heart disease.  During this procedure, a large X-ray machine, called a CT scanner, takes detailed pictures of the heart and the surrounding area after a contrast dye has been injected into blood vessels in the area.  Ask your health care provider about changing or stopping your regular medicines before the procedure. This is especially important if you are taking diabetes medicines, blood thinners, or medicines to treat erectile dysfunction.  If you are told, drink enough fluid to keep your urine pale yellow. This will help to flush the contrast dye out of your body. This information is not intended to replace advice given to you by your health care provider. Make sure you discuss any questions you have with your health care provider. Document Revised: 02/06/2019 Document Reviewed: 02/06/2019 Elsevier Patient Education  [employer].   Echocardiogram An echocardiogram is a procedure that uses painless sound  waves (ultrasound) to produce an image of the heart. Images from an echocardiogram can provide important information about:  Signs of coronary artery disease (CAD).  Aneurysm detection. An aneurysm is a weak or damaged part of an artery wall that bulges out from the normal force of blood pumping through the body.  Heart size and shape. Changes in the size or shape of the heart can be associated with certain conditions, including heart failure, aneurysm, and CAD.  Heart muscle function.  Heart valve function.  Signs of a past heart attack.  Fluid buildup around the heart.  Thickening of the heart muscle.  A tumor or infectious growth around the heart valves. Tell a health care provider about:  Any allergies you have.  All medicines you are taking, including vitamins, herbs, eye drops, creams, and over-the-counter medicines.  Any blood disorders you have.  Any surgeries you have had.  Any medical conditions you  have.  Whether you are pregnant or may be pregnant. What are the risks? Generally, this is a safe procedure. However, problems may occur, including:  Allergic reaction to dye (contrast) that may be used during the procedure. What happens before the procedure? No specific preparation is needed. You may eat and drink normally. What happens during the procedure?   An IV tube may be inserted into one of your veins.  You may receive contrast through this tube. A contrast is an injection that improves the quality of the pictures from your heart.  A gel will be applied to your chest.  A wand-like tool (transducer) will be moved over your chest. The gel will help to transmit the sound waves from the transducer.  The sound waves will harmlessly bounce off of your heart to allow the heart images to be captured in real-time motion. The images will be recorded on a computer. The procedure may vary among health care providers and hospitals. What happens after the procedure?  You may return to your normal, everyday life, including diet, activities, and medicines, unless your health care provider tells you not to do that. Summary  An echocardiogram is a procedure that uses painless sound waves (ultrasound) to produce an image of the heart.  Images from an echocardiogram can provide important information about the size and shape of your heart, heart muscle function, heart valve function, and fluid buildup around your heart.  You do not need to do anything to prepare before this procedure. You may eat and drink normally.  After the echocardiogram is completed, you may return to your normal, everyday life, unless your health care provider tells you not to do that. This information is not intended to replace advice given to you by your health care provider. Make sure you discuss any questions you have with your health care provider. Document Revised: 10/04/2018 Document Reviewed: 07/16/2016 Elsevier  Patient Education  [employer].  Nitroglycerin sublingual tablets What is this medicine? NITROGLYCERIN (nye troe GLI ser in) is a type of vasodilator. It relaxes blood vessels, increasing the blood and oxygen supply to your heart. This medicine is used to relieve chest pain caused by angina. It is also used to prevent chest pain before activities like climbing stairs, going outdoors in cold weather, or sexual activity. This medicine may be used for other purposes; ask your health care provider or pharmacist if you have questions. COMMON BRAND NAME(S): Nitroquick, Nitrostat, Nitrotab What should I tell my health care provider before I take this medicine? They need to know if  you have any of these conditions:  anemia  head injury, recent stroke, or bleeding in the brain  liver disease  previous heart attack  an unusual or allergic reaction to nitroglycerin, other medicines, foods, dyes, or preservatives  pregnant or trying to get pregnant  breast-feeding How should I use this medicine? Take this medicine by mouth as needed. At the first sign of an angina attack (chest pain or tightness) place one tablet under your tongue. You can also take this medicine 5 to 10 minutes before an event likely to produce chest pain. Follow the directions on the prescription label. Let the tablet dissolve under the tongue. Do not swallow whole. Replace the dose if you accidentally swallow it. It will help if your mouth is not dry. Saliva around the tablet will help it to dissolve more quickly. Do not eat or drink, smoke or chew tobacco while a tablet is dissolving. If you are not better within 5 minutes after taking ONE dose of nitroglycerin, call 9-1-1 immediately to seek emergency medical care. Do not take more than 3 nitroglycerin tablets over 15 minutes. If you take this medicine often to relieve symptoms of angina, your doctor or health care professional may provide you with different instructions to  manage your symptoms. If symptoms do not go away after following these instructions, it is important to call 9-1-1 immediately. Do not take more than 3 nitroglycerin tablets over 15 minutes. Talk to your pediatrician regarding the use of this medicine in children. Special care may be needed. Overdosage: If you think you have taken too much of this medicine contact a poison control center or emergency room at once. NOTE: This medicine is only for you. Do not share this medicine with others. What if I miss a dose? This does not apply. This medicine is only used as needed. What may interact with this medicine? Do not take this medicine with any of the following medications:  certain migraine medicines like ergotamine and dihydroergotamine (DHE)  medicines used to treat erectile dysfunction like sildenafil, tadalafil, and vardenafil  riociguat This medicine may also interact with the following medications:  alteplase  aspirin  heparin  medicines for high blood pressure  medicines for mental depression  other medicines used to treat angina  phenothiazines like chlorpromazine, mesoridazine, prochlorperazine, thioridazine This list may not describe all possible interactions. Give your health care provider a list of all the medicines, herbs, non-prescription drugs, or dietary supplements you use. Also tell them if you smoke, drink alcohol, or use illegal drugs. Some items may interact with your medicine. What should I watch for while using this medicine? Tell your doctor or health care professional if you feel your medicine is no longer working. Keep this medicine with you at all times. Sit or lie down when you take your medicine to prevent falling if you feel dizzy or faint after using it. Try to remain calm. This will help you to feel better faster. If you feel dizzy, take several deep breaths and lie down with your feet propped up, or bend forward with your head resting between your  knees. You may get drowsy or dizzy. Do not drive, use machinery, or do anything that needs mental alertness until you know how this drug affects you. Do not stand or sit up quickly, especially if you are an older patient. This reduces the risk of dizzy or fainting spells. Alcohol can make you more drowsy and dizzy. Avoid alcoholic drinks. Do not treat yourself for  coughs, colds, or pain while you are taking this medicine without asking your doctor or health care professional for advice. Some ingredients may increase your blood pressure. What side effects may I notice from receiving this medicine? Side effects that you should report to your doctor or health care professional as soon as possible:  blurred vision  dry mouth  skin rash  sweating  the feeling of extreme pressure in the head  unusually weak or tired Side effects that usually do not require medical attention (report to your doctor or health care professional if they continue or are bothersome):  flushing of the face or neck  headache  irregular heartbeat, palpitations  nausea, vomiting This list may not describe all possible side effects. Call your doctor for medical advice about side effects. You may report side effects to FDA at 1-800-FDA-1088. Where should I keep my medicine? Keep out of the reach of children. Store at room temperature between 20 and 25 degrees C (68 and 77 degrees F). Store in [employer]. Protect from light and moisture. Keep tightly closed. Throw away any unused medicine after the expiration date. NOTE: This sheet is a summary. It may not cover all possible information. If you have questions about this medicine, talk to your doctor, pharmacist, or health care provider.  2020 Elsevier/Gold Standard (2013-04-11 17:57:36)  Metoprolol Extended-Release Tablets What is this medicine? METOPROLOL (me TOE proe lole) is a beta blocker. It decreases the amount of work your heart has to do and helps  your heart beat regularly. It treats high blood pressure and/or prevent chest pain (also called angina). It also treats heart failure. This medicine may be used for other purposes; ask your health care provider or pharmacist if you have questions. COMMON BRAND NAME(S): toprol, Toprol XL What should I tell my health care provider before I take this medicine? They need to know if you have any of these conditions:  diabetes  heart or vessel disease like slow heart rate, worsening heart failure, heart block, sick sinus syndrome or Raynaud's disease  kidney disease  liver disease  lung or breathing disease, like asthma or emphysema  pheochromocytoma  thyroid disease  an unusual or allergic reaction to metoprolol, other beta-blockers, medicines, foods, dyes, or preservatives  pregnant or trying to get pregnant  breast-feeding How should I use this medicine? Take this drug by mouth. Take it as directed on the prescription label at the same time every day. Take it with food. You may cut the tablet in half if it is scored (has a line in the middle of it). This may help you swallow the tablet if the whole tablet is too big. Be sure to take both halves. Do not take just one-half of the tablet. Keep taking it unless your health care provider tells you to stop. Talk to your health care provider about the use of this drug in children. While it may be prescribed for children as young as 6 for selected conditions, precautions do apply. Overdosage: If you think you have taken too much of this medicine contact a poison control center or emergency room at once. NOTE: This medicine is only for you. Do not share this medicine with others. What if I miss a dose? If you miss a dose, take it as soon as you can. If it is almost time for your next dose, take only that dose. Do not take double or extra doses. What may interact with this medicine? This medicine may interact with  the following  medications:  certain medicines for blood pressure, heart disease, irregular heart beat  certain medicines for depression, like monoamine oxidase (MAO) inhibitors, fluoxetine, or paroxetine  clonidine  dobutamine  epinephrine  isoproterenol  reserpine This list may not describe all possible interactions. Give your health care provider a list of all the medicines, herbs, non-prescription drugs, or dietary supplements you use. Also tell them if you smoke, drink alcohol, or use illegal drugs. Some items may interact with your medicine. What should I watch for while using this medicine? Visit your doctor or health care professional for regular check ups. Contact your doctor right away if your symptoms worsen. Check your blood pressure and pulse rate regularly. Ask your health care professional what your blood pressure and pulse rate should be, and when you should contact them. You may get drowsy or dizzy. Do not drive, use machinery, or do anything that needs mental alertness until you know how this medicine affects you. Do not sit or stand up quickly, especially if you are an older patient. This reduces the risk of dizzy or fainting spells. Contact your doctor if these symptoms continue. Alcohol may interfere with the effect of this medicine. Avoid alcoholic drinks. This medicine may increase blood sugar. Ask your healthcare provider if changes in diet or medicines are needed if you have diabetes. What side effects may I notice from receiving this medicine? Side effects that you should report to your doctor or health care professional as soon as possible:  allergic reactions like skin rash, itching or hives  cold or numb hands or feet  depression  difficulty breathing  faint  fever with sore throat  irregular heartbeat, chest pain  rapid weight gain   signs and symptoms of high blood sugar such as being more thirsty or hungry or having to urinate more than normal. You may also  feel very tired or have blurry vision.  swollen legs or ankles Side effects that usually do not require medical attention (report to your doctor or health care professional if they continue or are bothersome):  anxiety or nervousness  change in sex drive or performance  dry skin  headache  nightmares or trouble sleeping  short term memory loss  stomach upset or diarrhea This list may not describe all possible side effects. Call your doctor for medical advice about side effects. You may report side effects to FDA at 1-800-FDA-1088. Where should I keep my medicine? Keep out of the reach of children and pets. Store at room temperature between 20 and 25 degrees C (68 and 77 degrees F). Throw away any unused drug after the expiration date. NOTE: This sheet is a summary. It may not cover all possible information. If you have questions about this medicine, talk to your doctor, pharmacist, or health care provider.  2020 Elsevier/Gold Standard (2019-01-24 18:23:00)       Adopting a Healthy Lifestyle.  Know what a healthy weight is for you (roughly BMI <25) and aim to maintain this   Aim for 7+ servings of fruits and vegetables daily   65-80+ fluid ounces of water or unsweet tea for healthy kidneys   Limit to max 1 drink of alcohol per day; avoid smoking/tobacco   Limit animal fats in diet for cholesterol and heart health - choose grass fed whenever available   Avoid highly processed foods, and foods high in saturated/trans fats   Aim for low stress - take time to unwind and care for your mental health  Aim for 150 min of moderate intensity exercise weekly for heart health, and weights twice weekly for bone health   Aim for 7-9 hours of sleep daily   When it comes to diets, agreement about the perfect plan isnt easy to find, even among the experts. Experts at the [employer] developed an idea known as the Healthy Eating Plate. Just imagine a plate  divided into logical, healthy portions.   The emphasis is on diet quality:   Load up on vegetables and fruits - one-half of your plate: Aim for color and variety, and remember that potatoes dont count.   Go for whole grains - one-quarter of your plate: Whole wheat, barley, wheat berries, quinoa, oats, brown rice, and foods made with them. If you want pasta, go with whole wheat pasta.   Protein power - one-quarter of your plate: Fish, chicken, beans, and nuts are all healthy, versatile protein sources. Limit red meat.   The diet, however, does go beyond the plate, offering a few other suggestions.   Use healthy plant oils, such as olive, canola, soy, corn, sunflower and peanut. Check the labels, and avoid partially hydrogenated oil, which have unhealthy trans fats.   If youre thirsty, drink water. Coffee and tea are good in moderation, but skip sugary drinks and limit milk and dairy products to one or two daily servings.   The type of carbohydrate in the diet is more important than the amount. Some sources of carbohydrates, such as vegetables, fruits, whole grains, and beans-are healthier than others.   Finally, stay active  Signed, Berniece Salines, DO  02/14/2020 2:57 PM    [facility]

## 2020-02-14 NOTE — Telephone Encounter (Signed)
Indeed, because was a potentially temporary change since he was planning on following w/ cardiology and per those notes, he told them today he is taking 25 mg, which doesn't match his med list or his report now.   Will route to cardiology

## 2020-02-15 LAB — WOUND CULTURE
MICRO NUMBER:: 10836169
SPECIMEN QUALITY:: ADEQUATE

## 2020-02-17 ENCOUNTER — Other Ambulatory Visit: Payer: Self-pay | Admitting: Osteopathic Medicine

## 2020-02-17 MED ORDER — LOSARTAN POTASSIUM 25 MG PO TABS: 50.0000 mg | ORAL_TABLET | Freq: Every day | ORAL | 0 refills | Status: DC

## 2020-02-19 ENCOUNTER — Other Ambulatory Visit: Payer: Self-pay

## 2020-02-19 DIAGNOSIS — I10 Essential (primary) hypertension: Secondary | ICD-10-CM

## 2020-02-19 MED ORDER — LOSARTAN POTASSIUM 50 MG PO TABS: 50.0000 mg | ORAL_TABLET | Freq: Every day | ORAL | 3 refills | Status: AC

## 2020-02-24 ENCOUNTER — Encounter: Payer: Self-pay | Admitting: Osteopathic Medicine

## 2020-03-13 ENCOUNTER — Ambulatory Visit (HOSPITAL_BASED_OUTPATIENT_CLINIC_OR_DEPARTMENT_OTHER)
Admission: RE | Admit: 2020-03-13 | Discharge: 2020-03-13 | Disposition: A | Discharge status: Home / Self Care | Payer: BC Managed Care – PPO | Source: Ambulatory Visit | Attending: Cardiology | Admitting: Cardiology

## 2020-03-13 ENCOUNTER — Other Ambulatory Visit: Payer: Self-pay

## 2020-03-13 DIAGNOSIS — R0600 Dyspnea, unspecified: Secondary | ICD-10-CM | POA: Diagnosis present

## 2020-03-13 DIAGNOSIS — R072 Precordial pain: Secondary | ICD-10-CM | POA: Diagnosis not present

## 2020-03-13 DIAGNOSIS — R5383 Other fatigue: Secondary | ICD-10-CM | POA: Diagnosis not present

## 2020-03-13 DIAGNOSIS — R0609 Other forms of dyspnea: Secondary | ICD-10-CM

## 2020-03-13 LAB — ECHOCARDIOGRAM COMPLETE
Area-P 1/2: 2.93 cm2
S' Lateral: 3.47 cm

## 2020-03-19 ENCOUNTER — Telehealth: Payer: Self-pay | Admitting: Cardiology

## 2020-03-19 NOTE — Telephone Encounter (Signed)
Called patient informed him of results.  

## 2020-03-19 NOTE — Telephone Encounter (Signed)
Per pt call stated returning this offices' call to give him his ECHO results please give him a call back.

## 2020-03-20 ENCOUNTER — Other Ambulatory Visit: Payer: Self-pay | Admitting: Osteopathic Medicine

## 2020-03-25 ENCOUNTER — Telehealth (HOSPITAL_COMMUNITY): Payer: Self-pay | Admitting: Emergency Medicine

## 2020-03-25 NOTE — Telephone Encounter (Signed)
Reaching out to patient to offer assistance regarding upcoming cardiac imaging study; pt verbalizes understanding of appt date/time, parking situation and where to check in, pre-test NPO status and medications ordered, and verified current allergies; name and call back number provided for further questions should they arise Madilyn Cephas RN Navigator Cardiac [facility] [phone] office [phone] cell 

## 2020-03-26 ENCOUNTER — Telehealth (HOSPITAL_COMMUNITY): Payer: Self-pay | Admitting: Emergency Medicine

## 2020-03-26 NOTE — Telephone Encounter (Signed)
Entered in error

## 2020-03-27 ENCOUNTER — Ambulatory Visit (HOSPITAL_COMMUNITY)
Admission: RE | Admit: 2020-03-27 | Discharge: 2020-03-27 | Disposition: A | Discharge status: Home / Self Care | Payer: BC Managed Care – PPO | Source: Ambulatory Visit | Attending: Cardiology | Admitting: Cardiology

## 2020-03-27 DIAGNOSIS — R072 Precordial pain: Secondary | ICD-10-CM

## 2020-03-27 MED ORDER — IOHEXOL 350 MG/ML SOLN
80.0000 mL | Freq: Once | INTRAVENOUS | Status: AC | PRN
  Administered 2020-03-27: 80 mL via INTRAVENOUS

## 2020-03-27 MED ORDER — METOPROLOL TARTRATE 5 MG/5ML IV SOLN
INTRAVENOUS | Status: AC
  Administered 2020-03-27: 10 mg via INTRAVENOUS
  Filled 2020-03-27: qty 10

## 2020-03-27 MED ORDER — NITROGLYCERIN 0.4 MG SL SUBL: 0.8000 mg | SUBLINGUAL_TABLET | Freq: Once | SUBLINGUAL | Status: AC

## 2020-03-27 MED ORDER — NITROGLYCERIN 0.4 MG SL SUBL
SUBLINGUAL_TABLET | SUBLINGUAL | Status: AC
  Administered 2020-03-27: 0.8 mg via SUBLINGUAL
  Filled 2020-03-27: qty 2

## 2020-03-31 ENCOUNTER — Telehealth: Payer: Self-pay

## 2020-03-31 NOTE — Telephone Encounter (Signed)
-----   Message from Berniece Salines, DO sent at 03/31/2020  4:48 PM EDT ----- Doristine Devoid results.  Normal coronary CTA.  Calcium score 0

## 2020-03-31 NOTE — Telephone Encounter (Signed)
Spoke with patient regarding results and recommendation.  Patient verbalizes understanding and is agreeable to plan of care. Advised patient to call back with any issues or concerns.  

## 2020-05-26 ENCOUNTER — Other Ambulatory Visit: Payer: Self-pay

## 2020-05-26 DIAGNOSIS — I10 Essential (primary) hypertension: Secondary | ICD-10-CM | POA: Insufficient documentation

## 2020-05-29 ENCOUNTER — Ambulatory Visit: Payer: BC Managed Care – PPO | Admitting: Cardiology

## 2020-05-29 ENCOUNTER — Encounter: Payer: Self-pay | Admitting: Cardiology

## 2020-05-29 ENCOUNTER — Other Ambulatory Visit: Payer: Self-pay

## 2020-05-29 VITALS — BP 130/88 | HR 80 | Ht 69.0 in | Wt 261.1 lb

## 2020-05-29 DIAGNOSIS — I10 Essential (primary) hypertension: Secondary | ICD-10-CM | POA: Diagnosis not present

## 2020-05-29 DIAGNOSIS — Z6837 Body mass index (BMI) 37.0-37.9, adult: Secondary | ICD-10-CM | POA: Diagnosis not present

## 2020-05-29 DIAGNOSIS — E785 Hyperlipidemia, unspecified: Secondary | ICD-10-CM | POA: Diagnosis not present

## 2020-05-29 MED ORDER — LOSARTAN POTASSIUM 25 MG PO TABS
25.0000 mg | ORAL_TABLET | Freq: Every day | ORAL | 3 refills | Status: AC
Start: 2020-05-29 — End: 2020-12-21

## 2020-05-29 NOTE — Progress Notes (Signed)
Cardiology Office Note:    Date:  05/29/2020   ID:  Tyler Snyder, DOB 05-22-1981, MRN [id number]  PCP:  [name], DO  Cardiologist:  Berniece Salines, DO  Electrophysiologist:  None   Referring MD: [name], DO   " I am doing well"  History of Present Illness:    Tyler Snyder is a 39 y.o. male with a hx of hypertension, hyperlipidemia, obesity, history of COVID-19 infection.  Presents for a follow up visit. He presented for chest pain. At that time he was experiencing intermittent chest pain, and shortness of breath.  Given his family history and symptoms with his abnormal EKG I recommend the patient undergo coronary CTA as well as an echocardiogram.  Today he tells me he feels a lot better.  The palpitation has improved since been placed on Toprol-XL. However his blood pressure is still not well controlled.  Past Medical History:  Diagnosis Date  . Abnormal weight gain 02/14/2019  . Acute bacterial conjunctivitis of right eye 01/16/2018  . Acute non-recurrent maxillary sinusitis 01/16/2018  . Chronic fatigue 02/14/2019  . Elevated liver enzymes 02/28/2019  . Essential hypertension 02/14/2019  . Hypertension   . Palpitation 02/14/2019  . Sorethroat 01/16/2018    Past Surgical History:  Procedure Laterality Date  . WISDOM TOOTH EXTRACTION      Current Medications: Current Meds  Medication Sig  . aspirin EC 81 MG tablet Take 1 tablet (81 mg total) by mouth daily.  Marland Kitchen atorvastatin (LIPITOR) 40 MG tablet Take 1 tablet (40 mg total) by mouth daily.  . hydrochlorothiazide (HYDRODIURIL) 12.5 MG tablet TAKE 2 TABLETS(25 MG) BY MOUTH DAILY  . losartan (COZAAR) 50 MG tablet Take 1 tablet (50 mg total) by mouth daily.  . metoprolol succinate (TOPROL XL) 25 MG 24 hr tablet Take 0.5 tablets (12.5 mg total) by mouth daily.     Allergies:   Codeine   Social History   Socioeconomic History  . Marital status: Divorced    Spouse name: Not on file  . Number of  children: Not on file  . Years of education: Not on file  . Highest education level: Not on file  Occupational History  . Not on file  Tobacco Use  . Smoking status: Never Smoker  . Smokeless tobacco: Never Used  Vaping Use  . Vaping Use: Never used  Substance and Sexual Activity  . Alcohol use: Never  . Drug use: Never  . Sexual activity: Yes    Partners: Female    Birth control/protection: Condom  Other Topics Concern  . Not on file  Social History Narrative  . Not on file   Social Determinants of Health   Financial Resource Strain:   . Difficulty of Paying Living Expenses: Not on file  Food Insecurity:   . Worried About Charity fundraiser in the Last Year: Not on file  . Ran Out of Food in the Last Year: Not on file  Transportation Needs:   . Lack of Transportation (Medical): Not on file  . Lack of Transportation (Non-Medical): Not on file  Physical Activity:   . Days of Exercise per Week: Not on file  . Minutes of Exercise per Session: Not on file  Stress:   . Feeling of Stress : Not on file  Social Connections:   . Frequency of Communication with Friends and Family: Not on file  . Frequency of Social Gatherings with Friends and Family: Not on file  . Attends Religious Services: Not  on file  . Active Member of Clubs or Organizations: Not on file  . Attends Archivist Meetings: Not on file  . Marital Status: Not on file     Family History: The patient's family history includes Heart disease in his paternal uncle, paternal uncle, paternal uncle, and paternal uncle; High blood pressure in his mother; Hypertension in his mother; Hyperthyroidism in his father; Rheum arthritis in his mother.  ROS:   Review of Systems  Constitution: Negative for decreased appetite, fever and weight gain.  HENT: Negative for congestion, ear discharge, hoarse voice and sore throat.   Eyes: Negative for discharge, redness, vision loss in right eye and visual halos.    Cardiovascular: Negative for chest pain, dyspnea on exertion, leg swelling, orthopnea and palpitations.  Respiratory: Negative for cough, hemoptysis, shortness of breath and snoring.   Endocrine: Negative for heat intolerance and polyphagia.  Hematologic/Lymphatic: Negative for bleeding problem. Does not bruise/bleed easily.  Skin: Negative for flushing, nail changes, rash and suspicious lesions.  Musculoskeletal: Negative for arthritis, joint pain, muscle cramps, myalgias, neck pain and stiffness.  Gastrointestinal: Negative for abdominal pain, bowel incontinence, diarrhea and excessive appetite.  Genitourinary: Negative for decreased libido, genital sores and incomplete emptying.  Neurological: Negative for brief paralysis, focal weakness, headaches and loss of balance.  Psychiatric/Behavioral: Negative for altered mental status, depression and suicidal ideas.  Allergic/Immunologic: Negative for HIV exposure and persistent infections.    EKGs/Labs/Other Studies Reviewed:    The following studies were reviewed today:   EKG: None today   Transthoracic echocardiogram IMPRESSIONS 1. Left ventricular ejection fraction, by estimation, is 55 to 60%. The left ventricle has normal function. Left ventricular endocardial border not optimally defined to evaluate regional wall motion. Left ventricular cavity diastolic parameters are consistent with Grade I diastolic dysfunction (impaired relaxation).  2. Right ventricular systolic function is normal. The right ventricular size is normal.  3. The mitral valve is normal in structure. No evidence of mitral valve regurgitation. No evidence of mitral stenosis.  4. The aortic valve is tricuspid. Aortic valve regurgitation is not visualized. No aortic stenosis is present.  5. The inferior vena cava is normal in size with greater than 50% respiratory variability, suggesting right atrial pressure of 3 mmHg.   Coronary CTA IMPRESSION: 1. Coronary  calcium score of 0.  Low risk.  2. Normal coronary origin with right dominance.  3. No evidence of CAD.  Recent Labs: 01/31/2020: ALT 55; BUN 13; Creat 0.79; Potassium 4.1; Sodium 138  Recent Lipid Panel    Component Value Date/Time   CHOL 181 01/31/2020 0000   TRIG 103 01/31/2020 0000   HDL 30 (L) 01/31/2020 0000   CHOLHDL 6.0 (H) 01/31/2020 0000   LDLCALC 130 (H) 01/31/2020 0000    Physical Exam:    VS:  BP 130/88   Pulse 80   Ht 5\' 9"  (1.753 m)   Wt 261 lb 1.6 oz (118.4 kg)   SpO2 98%   BMI 38.56 kg/m     Wt Readings from Last 3 Encounters:  05/29/20 261 lb 1.6 oz (118.4 kg)  02/14/20 252 lb (114.3 kg)  02/06/20 253 lb (114.8 kg)     GEN: Well nourished, well developed in no acute distress HEENT: Normal NECK: No JVD; No carotid bruits LYMPHATICS: No lymphadenopathy CARDIAC: S1S2 noted,RRR, no murmurs, rubs, gallops RESPIRATORY:  Clear to auscultation without rales, wheezing or rhonchi  ABDOMEN: Soft, non-tender, non-distended, +bowel sounds, no guarding. EXTREMITIES: No edema, No cyanosis, no  clubbing MUSCULOSKELETAL:  No deformity  SKIN: Warm and dry NEUROLOGIC:  Alert and oriented x 3, non-focal PSYCHIATRIC:  Normal affect, good insight  ASSESSMENT:    1. Essential hypertension   2. BMI 37.0-37.9, adult   3. Dyslipidemia (high LDL; low HDL)    PLAN:     1.  He still does have some diastolic hypertension however like to do is increase his losartan to 50 mg in the morning and 25 mg in the evening.  He will remain on hydrochlorothiazide and his Toprol-XL. Continue patient on his atorvastatin. The patient understands the need to lose weight with diet and exercise. We have discussed specific strategies for this.  We discussed his testing results again today no questions at this time.  The patient is in agreement with the above plan. The patient left the office in stable condition.  The patient will follow up in   Medication Adjustments/Labs and Tests  Ordered: Current medicines are reviewed at length with the patient today.  Concerns regarding medicines are outlined above.  No orders of the defined types were placed in this encounter.  Meds ordered this encounter  Medications  . losartan (COZAAR) 25 MG tablet    Sig: Take 1 tablet (25 mg total) by mouth daily. Daily after dinner.    Dispense:  90 tablet    Refill:  3    Patient Instructions  Medication Instructions:  Your physician has recommended you make the following change in your medication:   Start: Losartan 25mg  after dinner and 50mg  in the AM.  *If you need a refill on your cardiac medications before your next appointment, please call your pharmacy*   Lab Work: NONE If you have labs (blood work) drawn today and your tests are completely normal, you will receive your results only by: Marland Kitchen MyChart Message (if you have MyChart) OR . A paper copy in the mail If you have any lab test that is abnormal or we need to change your treatment, we will call you to review the results.   Testing/Procedures: NONE   Follow-Up: At [facility], you and your health needs are our priority.  As part of our continuing mission to provide you with exceptional heart care, we have created designated Provider Care Teams.  These Care Teams include your primary Cardiologist (physician) and Advanced Practice Providers (APPs -  Physician Assistants and Nurse Practitioners) who all work together to provide you with the care you need, when you need it.  We recommend signing up for the patient portal called "MyChart".  Sign up information is provided on this After Visit Summary.  MyChart is used to connect with patients for Virtual Visits (Telemedicine).  Patients are able to view lab/test results, encounter notes, upcoming appointments, etc.  Non-urgent messages can be sent to your provider as well.   To learn more about what you can do with MyChart, go to [email or web address].    Your next  appointment:   3 month(s)  The format for your next appointment:   In Person  Provider:   Berniece Salines, DO   Other Instructions      Adopting a Healthy Lifestyle.  Know what a healthy weight is for you (roughly BMI <25) and aim to maintain this   Aim for 7+ servings of fruits and vegetables daily   65-80+ fluid ounces of water or unsweet tea for healthy kidneys   Limit to max 1 drink of alcohol per day; avoid smoking/tobacco   Limit animal fats  in diet for cholesterol and heart health - choose grass fed whenever available   Avoid highly processed foods, and foods high in saturated/trans fats   Aim for low stress - take time to unwind and care for your mental health   Aim for 150 min of moderate intensity exercise weekly for heart health, and weights twice weekly for bone health   Aim for 7-9 hours of sleep daily   When it comes to diets, agreement about the perfect plan isnt easy to find, even among the experts. Experts at the [employer] developed an idea known as the Healthy Eating Plate. Just imagine a plate divided into logical, healthy portions.   The emphasis is on diet quality:   Load up on vegetables and fruits - one-half of your plate: Aim for color and variety, and remember that potatoes dont count.   Go for whole grains - one-quarter of your plate: Whole wheat, barley, wheat berries, quinoa, oats, brown rice, and foods made with them. If you want pasta, go with whole wheat pasta.   Protein power - one-quarter of your plate: Fish, chicken, beans, and nuts are all healthy, versatile protein sources. Limit red meat.   The diet, however, does go beyond the plate, offering a few other suggestions.   Use healthy plant oils, such as olive, canola, soy, corn, sunflower and peanut. Check the labels, and avoid partially hydrogenated oil, which have unhealthy trans fats.   If youre thirsty, drink water. Coffee and tea are good in moderation, but  skip sugary drinks and limit milk and dairy products to one or two daily servings.   The type of carbohydrate in the diet is more important than the amount. Some sources of carbohydrates, such as vegetables, fruits, whole grains, and beans-are healthier than others.   Finally, stay active  Signed, Berniece Salines, DO  05/29/2020 10:06 PM    [facility]

## 2020-05-29 NOTE — Patient Instructions (Signed)
Medication Instructions:  Your physician has recommended you make the following change in your medication:   Start: Losartan 25mg  after dinner and 50mg  in the AM.  *If you need a refill on your cardiac medications before your next appointment, please call your pharmacy*   Lab Work: NONE If you have labs (blood work) drawn today and your tests are completely normal, you will receive your results only by: Marland Kitchen MyChart Message (if you have MyChart) OR . A paper copy in the mail If you have any lab test that is abnormal or we need to change your treatment, we will call you to review the results.   Testing/Procedures: NONE   Follow-Up: At [facility], you and your health needs are our priority.  As part of our continuing mission to provide you with exceptional heart care, we have created designated Provider Care Teams.  These Care Teams include your primary Cardiologist (physician) and Advanced Practice Providers (APPs -  Physician Assistants and Nurse Practitioners) who all work together to provide you with the care you need, when you need it.  We recommend signing up for the patient portal called "MyChart".  Sign up information is provided on this After Visit Summary.  MyChart is used to connect with patients for Virtual Visits (Telemedicine).  Patients are able to view lab/test results, encounter notes, upcoming appointments, etc.  Non-urgent messages can be sent to your provider as well.   To learn more about what you can do with MyChart, go to [email or web address].    Your next appointment:   3 month(s)  The format for your next appointment:   In Person  Provider:   Berniece Salines, DO   Other Instructions

## 2020-06-23 ENCOUNTER — Other Ambulatory Visit: Payer: Self-pay | Admitting: Osteopathic Medicine

## 2020-06-30 ENCOUNTER — Other Ambulatory Visit: Payer: Self-pay | Admitting: Osteopathic Medicine

## 2020-06-30 MED ORDER — HYDROCHLOROTHIAZIDE 12.5 MG PO TABS: 12.5000 mg | ORAL_TABLET | Freq: Every day | ORAL | 0 refills | Status: DC

## 2020-07-01 ENCOUNTER — Emergency Department (INDEPENDENT_AMBULATORY_CARE_PROVIDER_SITE_OTHER)
Admission: EM | Admit: 2020-07-01 | Discharge: 2020-07-01 | Disposition: A | Discharge status: Home / Self Care | Payer: Self-pay | Source: Home / Self Care | Attending: Family Medicine | Admitting: Family Medicine

## 2020-07-01 DIAGNOSIS — J029 Acute pharyngitis, unspecified: Secondary | ICD-10-CM

## 2020-07-01 DIAGNOSIS — J069 Acute upper respiratory infection, unspecified: Secondary | ICD-10-CM

## 2020-07-01 DIAGNOSIS — Z1152 Encounter for screening for COVID-19: Secondary | ICD-10-CM

## 2020-07-01 LAB — POCT RAPID STREP A (OFFICE): Rapid Strep A Screen: NEGATIVE

## 2020-07-01 NOTE — Discharge Instructions (Signed)
Please try things such as zyrtec-D or allegra-D which is an antihistamine and decongestant.  Please try afrin which will help with nasal congestion but use for only three days.  Please also try using a netti pot on a regular occasion. Please try honey, vick's vapor rub, lozenges and humidifer for cough and sore throat   Please follow up if your symptoms fail to improve.  

## 2020-07-01 NOTE — ED Provider Notes (Signed)
[name]    CSN: [id number] Arrival date & time: 07/01/20  1604      History   Chief Complaint Chief Complaint  Patient presents with  . Sore Throat  . Nasal Congestion  . Cough    HPI Tyler Snyder is a 40 y.o. male. He is presenting with sinus pressure, congestion. No fever. His girlfriend did test positive for strep throat. No fevers. Has been vaccinated against COVID   HPI  Past Medical History:  Diagnosis Date  . Abnormal weight gain 02/14/2019  . Acute bacterial conjunctivitis of right eye 01/16/2018  . Acute non-recurrent maxillary sinusitis 01/16/2018  . Chronic fatigue 02/14/2019  . Elevated liver enzymes 02/28/2019  . Essential hypertension 02/14/2019  . Hypertension   . Palpitation 02/14/2019  . Sorethroat 01/16/2018    Patient Active Problem List   Diagnosis Date Noted  . Hypertension   . Other fatigue 02/14/2020  . Dyspnea on exertion 02/14/2020  . Precordial chest pain 02/14/2020  . Dyslipidemia (high LDL; low HDL) 02/14/2020  . Elevated liver enzymes 02/28/2019  . Essential hypertension 02/14/2019  . BMI 37.0-37.9, adult 02/14/2019  . Abnormal weight gain 02/14/2019  . Chronic fatigue 02/14/2019  . Palpitation 02/14/2019  . Acute bacterial conjunctivitis of right eye 01/16/2018  . Acute non-recurrent maxillary sinusitis 01/16/2018  . Elevated BP without diagnosis of hypertension 01/16/2018  . Sorethroat 01/16/2018    Past Surgical History:  Procedure Laterality Date  . WISDOM TOOTH EXTRACTION         Home Medications    Prior to Admission medications   Medication Sig Start Date End Date Taking? Authorizing Provider  DM-Doxylamine-Acetaminophen (NYQUIL COLD & FLU PO) Take 1 tablet by mouth.   Yes [provider]  aspirin EC 81 MG tablet Take 1 tablet (81 mg total) by mouth daily. 02/06/20   [name], DO  atorvastatin (LIPITOR) 40 MG tablet Take 1 tablet (40 mg total) by mouth daily. 02/06/20   [name], DO  hydrochlorothiazide (HYDRODIURIL) 12.5 MG tablet Take 1 tablet (12.5 mg total) by mouth daily. 06/30/20   [name], DO  losartan (COZAAR) 25 MG tablet Take 1 tablet (25 mg total) by mouth daily. Daily after dinner. 05/29/20 08/27/20  Tobb, Kardie, DO  losartan (COZAAR) 50 MG tablet Take 1 tablet (50 mg total) by mouth daily. 02/19/20   [name], DO  metoprolol succinate (TOPROL XL) 25 MG 24 hr tablet Take 0.5 tablets (12.5 mg total) by mouth daily. 02/14/20   Tobb, Kardie, DO  nitroGLYCERIN (NITROSTAT) 0.4 MG SL tablet Place 1 tablet (0.4 mg total) under the tongue every 5 (five) minutes as needed. 02/14/20 05/14/20  Berniece Salines, DO    Family History Family History  Problem Relation Age of Onset  . High blood pressure Mother   . Hypertension Mother   . Rheum arthritis Mother   . Hyperthyroidism Father   . Heart disease Paternal Uncle   . Heart disease Paternal Uncle   . Heart disease Paternal Uncle   . Heart disease Paternal Uncle     Social History Social History   Tobacco Use  . Smoking status: Never Smoker  . Smokeless tobacco: Never Used  Vaping Use  . Vaping Use: Never used  Substance Use Topics  . Alcohol use: Never  . Drug use: Never     Allergies   Codeine   Review of Systems Review of Systems  See HPI   Physical Exam Triage Vital Signs ED Triage  Vitals  Enc Vitals Group     BP 07/01/20 1754 (!) 142/89     Pulse Rate 07/01/20 1754 (!) 105     Resp 07/01/20 1754 20     Temp 07/01/20 1754 98 F (36.7 C)     Temp Source 07/01/20 1754 Oral     SpO2 07/01/20 1754 99 %     Weight 07/01/20 1751 260 lb (117.9 kg)     Height 07/01/20 1751 5' 9.5" (1.765 m)     Head Circumference --      Peak Flow --      Pain Score 07/01/20 1750 4     Pain Loc --      Pain Edu? --      Excl. in GC? --    No data found.  Updated Vital Signs BP (!) 142/89 (BP Location: Right Arm)   Pulse (!) 105   Temp 98 F (36.7 C) (Oral)   Resp 20   Ht  5' 9.5" (1.765 m)   Wt 117.9 kg   SpO2 99%   BMI 37.84 kg/m   Visual Acuity Right Eye Distance:   Left Eye Distance:   Bilateral Distance:    Right Eye Near:   Left Eye Near:    Bilateral Near:     Physical Exam Gen: NAD, alert, cooperative with exam, well-appearing ENT: normal lips, normal nasal mucosa, tympanic membranes clear and intact bilaterally, normal oropharynx, no cervical lymphadenopathy CV:   S1-S2   Resp: no accessory muscle use, non-labored, clear to auscultation bilaterally, no crackles or wheezes Psych:  normal insight, alert and oriented MSK: Normal gait, normal strength    UC Treatments / Results  Labs (all labs ordered are listed, but only abnormal results are displayed) Labs Reviewed  POCT RAPID STREP A (OFFICE)    EKG   Radiology No results found.  Procedures Procedures (including critical care time)  Medications Ordered in UC Medications - No data to display  Initial Impression / Assessment and Plan / UC Course  I have reviewed the triage vital signs and the nursing notes.  Pertinent labs & imaging results that were available during my care of the patient were reviewed by me and considered in my medical decision making (see chart for details).     Tyler Snyder is a 40 yo M that is presenting with viral type symptoms. Strep test was negative. Covid swab was obtained. Counseled on supportive care. Given indications to follow up.   Final Clinical Impressions(s) / UC Diagnoses   Final diagnoses:  Upper respiratory tract infection, unspecified type     Discharge Instructions     Please try things such as zyrtec-D or allegra-D which is an antihistamine and decongestant.  Please try afrin which will help with nasal congestion but use for only three days.  Please also try using a netti pot on a regular occasion. Please try honey, vick's vapor rub, lozenges and humidifer for cough and sore throat  Please follow up if your symptoms fail to  improve.     ED Prescriptions    None     PDMP not reviewed this encounter.   Myra Rude, MD 07/01/20 [phone]

## 2020-07-01 NOTE — ED Triage Notes (Addendum)
Pt presents to Urgent Care with c/o sore throat, cough, and nasal congestion x 3 days.  Pt w/ no known COVID exposure; has been vaccinated. Pt reports his girlfriend has strep throat.

## 2020-07-05 LAB — CULTURE, GROUP A STREP

## 2020-07-06 LAB — COVID-19, FLU A+B NAA
Influenza A, NAA: NOT DETECTED
Influenza B, NAA: NOT DETECTED
SARS-CoV-2, NAA: NOT DETECTED

## 2020-07-27 ENCOUNTER — Encounter: Payer: Self-pay | Admitting: Osteopathic Medicine

## 2020-07-27 DIAGNOSIS — K921 Melena: Secondary | ICD-10-CM

## 2020-07-29 ENCOUNTER — Encounter: Payer: Self-pay | Admitting: Gastroenterology

## 2020-07-30 ENCOUNTER — Encounter: Payer: Self-pay | Admitting: Gastroenterology

## 2020-08-20 ENCOUNTER — Ambulatory Visit: Payer: BC Managed Care – PPO | Admitting: Sports Medicine

## 2020-08-20 ENCOUNTER — Other Ambulatory Visit: Payer: Self-pay

## 2020-08-20 ENCOUNTER — Ambulatory Visit (INDEPENDENT_AMBULATORY_CARE_PROVIDER_SITE_OTHER): Payer: BC Managed Care – PPO

## 2020-08-20 DIAGNOSIS — M7712 Lateral epicondylitis, left elbow: Secondary | ICD-10-CM | POA: Insufficient documentation

## 2020-08-20 DIAGNOSIS — I10 Essential (primary) hypertension: Secondary | ICD-10-CM

## 2020-08-20 MED ORDER — MELOXICAM 15 MG PO TABS: ORAL_TABLET | ORAL | 3 refills | Status: AC

## 2020-08-20 NOTE — Progress Notes (Signed)
    Procedures performed today:    None.  Independent interpretation of notes and tests performed by another provider:   None.  Brief History, Exam, Impression, and Recommendations:    Lateral epicondylitis, left elbow Tyler Snyder is a pleasant 40 year old male, he has had 2 months of pain on his lateral elbow, no abnormal activities, trauma. On exam he has tenderness at the common extensor tendon origin with reproduction of pain with resisted extension of the middle finger, no reproduction of pain with biceps testing. Adding meloxicam, home rehab exercises. I did put pressure where we would typically put a counterforce brace and this did not improve his symptoms so we will hold off on bracing. Adding baseline x-rays. Return to see me in a month or 6 weeks and we will do an injection if no better.    ___________________________________________ Gwen Her. Dianah Field, M.D., ABFM., CAQSM. Primary Care and [place] Instructor of [employer] [facility]

## 2020-08-20 NOTE — Assessment & Plan Note (Signed)
Tyler Snyder is a pleasant 40 year old male, he has had 2 months of pain on his lateral elbow, no abnormal activities, trauma. On exam he has tenderness at the common extensor tendon origin with reproduction of pain with resisted extension of the middle finger, no reproduction of pain with biceps testing. Adding meloxicam, home rehab exercises. I did put pressure where we would typically put a counterforce brace and this did not improve his symptoms so we will hold off on bracing. Adding baseline x-rays. Return to see me in a month or 6 weeks and we will do an injection if no better.

## 2020-08-21 ENCOUNTER — Encounter: Payer: Self-pay | Admitting: Gastroenterology

## 2020-08-21 ENCOUNTER — Ambulatory Visit: Payer: BC Managed Care – PPO | Admitting: Gastroenterology

## 2020-08-21 VITALS — BP 132/78 | HR 96 | Ht 69.0 in | Wt 267.2 lb

## 2020-08-21 DIAGNOSIS — K625 Hemorrhage of anus and rectum: Secondary | ICD-10-CM | POA: Diagnosis not present

## 2020-08-21 NOTE — Patient Instructions (Signed)
If you are age 40 or older, your body mass index should be between 23-30. Your Body mass index is 39.47 kg/m. If this is out of the aforementioned range listed, please consider follow up with your Primary Care Provider.  If you are age 32 or younger, your body mass index should be between 19-25. Your Body mass index is 39.47 kg/m. If this is out of the aformentioned range listed, please consider follow up with your Primary Care Provider.   You have been scheduled for a colonoscopy. Please follow written instructions given to you at your visit today.  Please pick up your prep supplies at the pharmacy within the next 1-3 days. If you use inhalers (even only as needed), please bring them with you on the day of your procedure.  Please call with any questions.  Thank you,  Dr. Jackquline Denmark

## 2020-08-21 NOTE — Progress Notes (Signed)
Chief Complaint: Rectal bleeding  Referring Provider:  [name], DO      ASSESSMENT AND PLAN;   #1. Rectal bleeding. D/d hoids, AVMs, colitis, polyps, stercoral ulcers etc, r/o colonic neoplasms or IBD.  Plan: -Proceed with colonoscopy in April 2022.  Certainly earlier, if still with problems.  Hopefully his cardiology work-up will be complete by then.  Otherwise, we will reschedule. -CBC @ time of above procedure.   Discussed risks & benefits. Risks including rare perforation req laparotomy, bleeding after bx/polypectomy req blood transfusion, rarely missing neoplasms, risks of anesthesia/sedation. Benefits outweigh the risks. Patient agrees to proceed. All the questions were answered. Consent forms given for review.    HPI:    Tyler Snyder is a 40 y.o. male   With intermittent rectal bleeding since August 2021, mostly away from the stool but sometimes mixed.  No significant abdominal pain or rectal pain.  Seen by Dr. Sheppard Coil, had a neg rectal examination.  Has been advised to get colonoscopy.  No bleeding currently.  He denies having any diarrhea or constipation.  He normally has 2-4 BMs/day, after eating.  No significant abdominal bloating but does pass significant gas.  Denies having any history of pellet-like stools.  No nausea, vomiting, heartburn, regurgitation, odynophagia or dysphagia.  No weight loss.  In fact he has gained weight.  He had mildly abnormal LFTs which has been attributed to fatty liver.  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  Also found to have diastolic hypertension.  Had negative 2D echo.  He has follow-up appointment with Dr. Harriet Masson in March 2022.  Hence, would like to get colonoscopy performed in April.  Past Medical History:  Diagnosis Date  . Abnormal weight gain 02/14/2019  . Acute bacterial conjunctivitis of right eye 01/16/2018  . Acute non-recurrent maxillary sinusitis 01/16/2018  . Chronic  fatigue 02/14/2019  . Elevated liver enzymes 02/28/2019  . Essential hypertension 02/14/2019  . Hypertension   . Palpitation 02/14/2019  . Sorethroat 01/16/2018    Past Surgical History:  Procedure Laterality Date  . WISDOM TOOTH EXTRACTION      Family History  Problem Relation Age of Onset  . High blood pressure Mother   . Hypertension Mother   . Rheum arthritis Mother   . Hyperthyroidism Father   . Heart disease Paternal Uncle   . Heart disease Paternal Uncle   . Heart disease Paternal Uncle   . Heart disease Paternal Uncle   . Colon cancer Neg Hx   . Esophageal cancer Neg Hx   . Kidney disease Neg Hx   . Liver cancer Neg Hx   . Pancreatic cancer Neg Hx   . Prostate cancer Neg Hx   . Rectal cancer Neg Hx     Social History   Tobacco Use  . Smoking status: Never Smoker  . Smokeless tobacco: Never Used  Vaping Use  . Vaping Use: Never used  Substance Use Topics  . Alcohol use: Never  . Drug use: Never    Current Outpatient Medications  Medication Sig Dispense Refill  . aspirin EC 81 MG tablet Take 1 tablet (81 mg total) by mouth daily. 90 tablet 3  . atorvastatin (LIPITOR) 40 MG tablet Take 1 tablet (40 mg total) by mouth daily. 90 tablet 3  . hydrochlorothiazide (HYDRODIURIL) 12.5 MG tablet Take 1 tablet (12.5 mg total) by mouth daily. (Patient taking differently: Take 12.5 mg by mouth 2 (two) times daily.) 180 tablet 0  . losartan (  COZAAR) 25 MG tablet Take 1 tablet (25 mg total) by mouth daily. Daily after dinner. 90 tablet 3  . losartan (COZAAR) 50 MG tablet Take 1 tablet (50 mg total) by mouth daily. 90 tablet 3  . meloxicam (MOBIC) 15 MG tablet One tab PO qAM with a meal for 2 weeks, then daily prn pain. (Patient not taking: Reported on 08/21/2020) 30 tablet 3  . metoprolol succinate (TOPROL XL) 25 MG 24 hr tablet Take 0.5 tablets (12.5 mg total) by mouth daily. 45 tablet 2  . nitroGLYCERIN (NITROSTAT) 0.4 MG SL tablet Place 1 tablet (0.4 mg total) under the tongue  every 5 (five) minutes as needed. 25 tablet 6   No current facility-administered medications for this visit.    Allergies  Allergen Reactions  . Codeine Anaphylaxis    Review of Systems:  Constitutional: Denies fever, chills, diaphoresis, appetite change and fatigue.  HEENT: Denies photophobia, eye pain, redness, hearing loss, ear pain, congestion, sore throat, rhinorrhea, sneezing, mouth sores, neck pain, neck stiffness and tinnitus.   Respiratory: Denies SOB, DOE, cough, chest tightness,  and wheezing.   Cardiovascular: Denies chest pain, palpitations and leg swelling.  Genitourinary: Denies dysuria, urgency, frequency, hematuria, flank pain and difficulty urinating.  Musculoskeletal: Denies myalgias, has back pain, no joint swelling, arthralgias and gait problem.  Skin: No rash. Occ itching. Neurological: Denies dizziness, seizures, syncope, weakness, light-headedness, numbness and headaches.  Hematological: Denies adenopathy. Easy bruising, personal or family bleeding history  Psychiatric/Behavioral: No anxiety or depression     Physical Exam:    BP 132/78 (BP Location: Left Arm, Patient Position: Sitting, Cuff Size: Normal)   Pulse 96   Ht 5\' 9"  (1.753 m)   Wt 267 lb 4 oz (121.2 kg)   BMI 39.47 kg/m  Wt Readings from Last 3 Encounters:  08/21/20 267 lb 4 oz (121.2 kg)  07/01/20 260 lb (117.9 kg)  05/29/20 261 lb 1.6 oz (118.4 kg)   Constitutional:  Well-developed, in no acute distress. Psychiatric: Normal mood and affect. Behavior is normal. HEENT: Conjunctivae are normal. No scleral icterus. Cardiovascular: Normal rate, regular rhythm. No edema Pulmonary/chest: Effort normal and breath sounds normal. No wheezing, rales or rhonchi. Abdominal: Soft, nondistended. Nontender. Bowel sounds active throughout. There are no masses palpable. No hepatomegaly. Rectal: To be performed at the time of colonoscopy  neurological: Alert and oriented to person place and time. Skin:  Skin is warm and dry. No rashes noted.  Data Reviewed: I have personally reviewed following labs and imaging studies  CBC: CBC Latest Ref Rng & Units 02/19/2019  WBC 3.8 - 10.8 Thousand/uL 6.4  Hemoglobin 13.2 - 17.1 g/dL 13.9  Hematocrit 38.5 - 50.0 % 41.0  Platelets 140 - 400 Thousand/uL 214    CMP: CMP Latest Ref Rng & Units 01/31/2020 02/19/2019  Glucose 65 - 99 mg/dL 103(H) 97  BUN 7 - 25 mg/dL 13 13  Creatinine 0.60 - 1.35 mg/dL 0.79 0.79  Sodium 135 - 146 mmol/L 138 139  Potassium 3.5 - 5.3 mmol/L 4.1 4.0  Chloride 98 - 110 mmol/L 104 104  CO2 20 - 32 mmol/L 24 27  Calcium 8.6 - 10.3 mg/dL 9.1 9.3  Total Protein 6.1 - 8.1 g/dL 7.4 7.8  Total Bilirubin 0.2 - 1.2 mg/dL 0.5 0.7  AST 10 - 40 U/L 30 33  ALT 9 - 46 U/L 55(H) 63(H)      Carmell Austria, MD 08/21/2020, 2:34 PM  Cc: [name], DO

## 2020-09-16 ENCOUNTER — Other Ambulatory Visit: Payer: Self-pay | Admitting: Cardiology

## 2020-09-17 LAB — BASIC METABOLIC PANEL
BUN/Creatinine Ratio: 18 (ref 9–20)
BUN: 12 mg/dL (ref 6–20)
CO2: 23 mmol/L (ref 20–29)
Calcium: 9.4 mg/dL (ref 8.7–10.2)
Chloride: 100 mmol/L (ref 96–106)
Creatinine, Ser: 0.66 mg/dL — ABNORMAL LOW (ref 0.76–1.27)
Glucose: 91 mg/dL (ref 65–99)
Potassium: 3.8 mmol/L (ref 3.5–5.2)
Sodium: 139 mmol/L (ref 134–144)
eGFR: 122 mL/min/{1.73_m2} (ref >59)

## 2020-09-17 LAB — MAGNESIUM: Magnesium: 1.9 mg/dL (ref 1.6–2.3)

## 2020-09-18 ENCOUNTER — Ambulatory Visit (INDEPENDENT_AMBULATORY_CARE_PROVIDER_SITE_OTHER): Payer: BC Managed Care – PPO | Admitting: Cardiology

## 2020-09-18 ENCOUNTER — Other Ambulatory Visit: Payer: Self-pay

## 2020-09-18 ENCOUNTER — Encounter: Payer: Self-pay | Admitting: Cardiology

## 2020-09-18 VITALS — BP 122/80 | Ht 69.0 in | Wt 273.0 lb

## 2020-09-18 DIAGNOSIS — I10 Essential (primary) hypertension: Secondary | ICD-10-CM | POA: Diagnosis not present

## 2020-09-18 DIAGNOSIS — E785 Hyperlipidemia, unspecified: Secondary | ICD-10-CM

## 2020-09-18 LAB — LIPID PANEL
Chol/HDL Ratio: 5 ratio (ref 0.0–5.0)
Cholesterol, Total: 159 mg/dL (ref 100–199)
HDL: 32 mg/dL — ABNORMAL LOW (ref >39)
LDL Chol Calc (NIH): 102 mg/dL — ABNORMAL HIGH (ref 0–99)
Triglycerides: 140 mg/dL (ref 0–149)
VLDL Cholesterol Cal: 25 mg/dL (ref 5–40)

## 2020-09-18 LAB — SPECIMEN STATUS REPORT

## 2020-09-18 MED ORDER — HYDROCHLOROTHIAZIDE 25 MG PO TABS
25.0000 mg | ORAL_TABLET | Freq: Every day | ORAL | 3 refills | Status: DC
Start: 2020-09-18 — End: 2021-10-08

## 2020-09-18 NOTE — Patient Instructions (Addendum)
Medication Instructions:  Your physician has recommended you make the following change in your medication:  START: Hydrochlorothiazide 25 mg daily *If you need a refill on your cardiac medications before your next appointment, please call your pharmacy*   Lab Work: None If you have labs (blood work) drawn today and your tests are completely normal, you will receive your results only by: Marland Kitchen MyChart Message (if you have MyChart) OR . A paper copy in the mail If you have any lab test that is abnormal or we need to change your treatment, we will call you to review the results.   Testing/Procedures: None   Follow-Up: At [facility], you and your health needs are our priority.  As part of our continuing mission to provide you with exceptional heart care, we have created designated Provider Care Teams.  These Care Teams include your primary Cardiologist (physician) and Advanced Practice Providers (APPs -  Physician Assistants and Nurse Practitioners) who all work together to provide you with the care you need, when you need it.  We recommend signing up for the patient portal called "MyChart".  Sign up information is provided on this After Visit Summary.  MyChart is used to connect with patients for Virtual Visits (Telemedicine).  Patients are able to view lab/test results, encounter notes, upcoming appointments, etc.  Non-urgent messages can be sent to your provider as well.   To learn more about what you can do with MyChart, go to [email or web address].    Your next appointment:   1 year(s)  The format for your next appointment:   In Person  Provider:   Berniece Salines, DO   Other Instructions Cleared for colonoscopy from cardiologist stand point.

## 2020-09-18 NOTE — Progress Notes (Signed)
Cardiology Office Note:    Date:  09/18/2020   ID:  Tyler Snyder, DOB [birth year], MRN [id number]  PCP:  [name], DO  Cardiologist:  Berniece Salines, DO  Electrophysiologist:  None   Referring MD: [name], DO     History of Present Illness:    Tyler Snyder is a 40 y.o. male with a hx of hypertension, hyperlipidemia,obesity, history of COVID-19 infection.  The patient had some intermittent chest pain shortness of breath we will send him for a coronary CTA which came back with no evidence of coronary artery disease and calcium score 0.  He also had an echocardiogram which showed EF 55 to 60% with grade 1 diastolic dysfunction and no valvular abnormalities.  He tells me  Past Medical History:  Diagnosis Date  . Abnormal weight gain 02/14/2019  . Acute bacterial conjunctivitis of right eye 01/16/2018  . Acute non-recurrent maxillary sinusitis 01/16/2018  . Chronic fatigue 02/14/2019  . Elevated liver enzymes 02/28/2019  . Essential hypertension 02/14/2019  . Hypertension   . Palpitation 02/14/2019  . Sorethroat 01/16/2018    Past Surgical History:  Procedure Laterality Date  . WISDOM TOOTH EXTRACTION      Current Medications: Current Meds  Medication Sig  . aspirin EC 81 MG tablet Take 1 tablet (81 mg total) by mouth daily.  Marland Kitchen atorvastatin (LIPITOR) 40 MG tablet Take 1 tablet (40 mg total) by mouth daily.  . hydrochlorothiazide (HYDRODIURIL) 25 MG tablet Take 1 tablet (25 mg total) by mouth daily.  Marland Kitchen losartan (COZAAR) 50 MG tablet Take 1 tablet (50 mg total) by mouth daily.  . meloxicam (MOBIC) 15 MG tablet One tab PO qAM with a meal for 2 weeks, then daily prn pain.  . metoprolol succinate (TOPROL XL) 25 MG 24 hr tablet Take 0.5 tablets (12.5 mg total) by mouth daily.  . [DISCONTINUED] hydrochlorothiazide (HYDRODIURIL) 12.5 MG tablet Take 1 tablet (12.5 mg total) by mouth daily. (Patient taking differently: Take 12.5 mg by mouth 2 (two) times daily.)      Allergies:   Codeine   Social History   Socioeconomic History  . Marital status: Divorced    Spouse name: Not on file  . Number of children: Not on file  . Years of education: Not on file  . Highest education level: Not on file  Occupational History  . Not on file  Tobacco Use  . Smoking status: Never Smoker  . Smokeless tobacco: Never Used  Vaping Use  . Vaping Use: Never used  Substance and Sexual Activity  . Alcohol use: Never  . Drug use: Never  . Sexual activity: Yes    Partners: Female    Birth control/protection: Condom  Other Topics Concern  . Not on file  Social History Narrative  . Not on file   Social Determinants of Health   Financial Resource Strain: Not on file  Food Insecurity: Not on file  Transportation Needs: Not on file  Physical Activity: Not on file  Stress: Not on file  Social Connections: Not on file     Family History: The patient's family history includes Heart disease in his paternal uncle, paternal uncle, paternal uncle, and paternal uncle; High blood pressure in his mother; Hypertension in his mother; Hyperthyroidism in his father; Rheum arthritis in his mother. There is no history of Colon cancer, Esophageal cancer, Kidney disease, Liver cancer, Pancreatic cancer, Prostate cancer, or Rectal cancer.  ROS:   Review of Systems  Constitution: Negative for decreased appetite,  fever and weight gain.  HENT: Negative for congestion, ear discharge, hoarse voice and sore throat.   Eyes: Negative for discharge, redness, vision loss in right eye and visual halos.  Cardiovascular: Negative for chest pain, dyspnea on exertion, leg swelling, orthopnea and palpitations.  Respiratory: Negative for cough, hemoptysis, shortness of breath and snoring.   Endocrine: Negative for heat intolerance and polyphagia.  Hematologic/Lymphatic: Negative for bleeding problem. Does not bruise/bleed easily.  Skin: Negative for flushing, nail changes, rash and  suspicious lesions.  Musculoskeletal: Negative for arthritis, joint pain, muscle cramps, myalgias, neck pain and stiffness.  Gastrointestinal: Negative for abdominal pain, bowel incontinence, diarrhea and excessive appetite.  Genitourinary: Negative for decreased libido, genital sores and incomplete emptying.  Neurological: Negative for brief paralysis, focal weakness, headaches and loss of balance.  Psychiatric/Behavioral: Negative for altered mental status, depression and suicidal ideas.  Allergic/Immunologic: Negative for HIV exposure and persistent infections.    EKGs/Labs/Other Studies Reviewed:    The following studies were reviewed today:   EKG:  The ekg ordered today demonstrates   Transthoracic echocardiogram March 13, 2020 IMPRESSIONS    1. Left ventricular ejection fraction, by estimation, is 55 to 60%. The  left ventricle has normal function. Left ventricular endocardial border  not optimally defined to evaluate regional wall motion. Left ventricular  diastolic parameters are consistent  with Grade I diastolic dysfunction (impaired relaxation).  2. Right ventricular systolic function is normal. The right ventricular  size is normal.  3. The mitral valve is normal in structure. No evidence of mitral valve  regurgitation. No evidence of mitral stenosis.  4. The aortic valve is tricuspid. Aortic valve regurgitation is not  visualized. No aortic stenosis is present.  5. The inferior vena cava is normal in size with greater than 50%  respiratory variability, suggesting right atrial pressure of 3 mmHg.   FINDINGS  Left Ventricle: Left ventricular ejection fraction, by estimation, is 55  to 60%. The left ventricle has normal global systolic function. Left  ventricular endocardial border not optimally defined to evaluate regional  wall motion. The left ventricular  internal cavity size was normal in size. There is borderline left  ventricular hypertrophy. Left  ventricular diastolic parameters are  consistent with Grade I diastolic dysfunction (impaired relaxation).  Normal left ventricular filling pressure.   Right Ventricle: The right ventricular size is normal. No increase in  right ventricular wall thickness. Right ventricular systolic function is  normal.   Left Atrium: Left atrial size was normal in size.   Right Atrium: Right atrial size was not well visualized.   Pericardium: There is no evidence of pericardial effusion.   Mitral Valve: The mitral valve is normal in structure. No evidence of  mitral valve regurgitation. No evidence of mitral valve stenosis.   Tricuspid Valve: The tricuspid valve is normal in structure. Tricuspid  valve regurgitation is not demonstrated. No evidence of tricuspid  stenosis.   Aortic Valve: The aortic valve is tricuspid. Aortic valve regurgitation is  not visualized. No aortic stenosis is present.   Pulmonic Valve: The pulmonic valve was not well visualized. Pulmonic valve  regurgitation is not visualized. No evidence of pulmonic stenosis.   Aorta: The aortic root and ascending aorta are structurally normal, with  no evidence of dilitation.   Venous: The pulmonary veins were not well visualized. The inferior vena  cava is normal in size with greater than 50% respiratory variability,  suggesting right atrial pressure of 3 mmHg.   IAS/Shunts: No atrial  level shunt detected by color flow Doppler.    Coronary CTA March 27, 2020 Aorta:  Normal size.  No calcifications.  No dissection.  Aortic Valve:  Trileaflet.  No calcifications.  Coronary Arteries:  Normal coronary origin.  Right dominance.  RCA is a large dominant artery that gives rise to PDA and PLA. There is no plaque.  Left main is a large artery that gives rise to LAD and LCX arteries.  LAD is a large vessel that has no plaque. One large diagonal branch.  LCX is a non-dominant artery that gives rise to one large  OM1 branch. There is no plaque.  Other findings:  Normal pulmonary vein drainage into the left atrium.  Normal left atrial appendage without a thrombus.  Normal size of the pulmonary artery.  Please see radiology report for non cardiac findings.  IMPRESSION: 1. Coronary calcium score of 0.  Low risk.  2. Normal coronary origin with right dominance.  3. No evidence of CAD.  4. CAD-RADS 0. No evidence of CAD (0%). Consider non-atherosclerotic causes of chest pain.   Recent Labs: 01/31/2020: ALT 55; BUN 13; Creat 0.79; Potassium 4.1; Sodium 138  Recent Lipid Panel    Component Value Date/Time   CHOL 181 01/31/2020 0000   TRIG 103 01/31/2020 0000   HDL 30 (L) 01/31/2020 0000   CHOLHDL 6.0 (H) 01/31/2020 0000   LDLCALC 130 (H) 01/31/2020 0000    Physical Exam:    VS:  BP 122/80   Ht 5\' 9"  (1.753 m)   Wt 273 lb 0.6 oz (123.9 kg)   BMI 40.32 kg/m     Wt Readings from Last 3 Encounters:  09/18/20 273 lb 0.6 oz (123.9 kg)  08/21/20 267 lb 4 oz (121.2 kg)  07/01/20 260 lb (117.9 kg)     GEN: Well nourished, well developed in no acute distress HEENT: Normal NECK: No JVD; No carotid bruits LYMPHATICS: No lymphadenopathy CARDIAC: S1S2 noted,RRR, no murmurs, rubs, gallops RESPIRATORY:  Clear to auscultation without rales, wheezing or rhonchi  ABDOMEN: Soft, non-tender, non-distended, +bowel sounds, no guarding. EXTREMITIES: No edema, No cyanosis, no clubbing MUSCULOSKELETAL:  No deformity  SKIN: Warm and dry NEUROLOGIC:  Alert and oriented x 3, non-focal PSYCHIATRIC:  Normal affect, good insight  ASSESSMENT:    1. Essential hypertension   2. Dyslipidemia (high LDL; low HDL)   3. Morbid obesity (HCC)    PLAN:     The increase of his losartan to 50 mg in the morning and 25 mg at nighttime his hydrochlorothiazide was also increased to 25 mg daily and he continues to Toprol-XL 12.5 mg daily.  His blood pressure he tells me is now always less than 130/80.   In the office today it is acceptable I will make no changes to his medication regimen.  Hyperlipidemia - continue with current statin medication.  The patient understands the need to lose weight with diet and exercise. We have discussed specific strategies for this.  He is planning a colonoscopy from a cardiovascular standpoint the patient should be able to see with his procedure with no need for any further testing.  The patient is in agreement with the above plan. The patient left the office in stable condition.  The patient will follow up in 1 year or sooner if needed.   Medication Adjustments/Labs and Tests Ordered: Current medicines are reviewed at length with the patient today.  Concerns regarding medicines are outlined above.  No orders of the defined types were  placed in this encounter.  Meds ordered this encounter  Medications  . hydrochlorothiazide (HYDRODIURIL) 25 MG tablet    Sig: Take 1 tablet (25 mg total) by mouth daily.    Dispense:  90 tablet    Refill:  3    Patient Instructions  Medication Instructions:  Your physician has recommended you make the following change in your medication:  START: Hydrochlorothiazide 25 mg daily *If you need a refill on your cardiac medications before your next appointment, please call your pharmacy*   Lab Work: None If you have labs (blood work) drawn today and your tests are completely normal, you will receive your results only by: Marland Kitchen MyChart Message (if you have MyChart) OR . A paper copy in the mail If you have any lab test that is abnormal or we need to change your treatment, we will call you to review the results.   Testing/Procedures: None   Follow-Up: At [facility], you and your health needs are our priority.  As part of our continuing mission to provide you with exceptional heart care, we have created designated Provider Care Teams.  These Care Teams include your primary Cardiologist (physician) and Advanced  Practice Providers (APPs -  Physician Assistants and Nurse Practitioners) who all work together to provide you with the care you need, when you need it.  We recommend signing up for the patient portal called "MyChart".  Sign up information is provided on this After Visit Summary.  MyChart is used to connect with patients for Virtual Visits (Telemedicine).  Patients are able to view lab/test results, encounter notes, upcoming appointments, etc.  Non-urgent messages can be sent to your provider as well.   To learn more about what you can do with MyChart, go to [email or web address].    Your next appointment:   1 year(s)  The format for your next appointment:   In Person  Provider:   Berniece Salines, DO   Other Instructions Cleared for colonoscopy from cardiologist stand point.       Adopting a Healthy Lifestyle.  Know what a healthy weight is for you (roughly BMI <25) and aim to maintain this   Aim for 7+ servings of fruits and vegetables daily   65-80+ fluid ounces of water or unsweet tea for healthy kidneys   Limit to max 1 drink of alcohol per day; avoid smoking/tobacco   Limit animal fats in diet for cholesterol and heart health - choose grass fed whenever available   Avoid highly processed foods, and foods high in saturated/trans fats   Aim for low stress - take time to unwind and care for your mental health   Aim for 150 min of moderate intensity exercise weekly for heart health, and weights twice weekly for bone health   Aim for 7-9 hours of sleep daily   When it comes to diets, agreement about the perfect plan isnt easy to find, even among the experts. Experts at the [employer] developed an idea known as the Healthy Eating Plate. Just imagine a plate divided into logical, healthy portions.   The emphasis is on diet quality:   Load up on vegetables and fruits - one-half of your plate: Aim for color and variety, and remember that potatoes dont  count.   Go for whole grains - one-quarter of your plate: Whole wheat, barley, wheat berries, quinoa, oats, brown rice, and foods made with them. If you want pasta, go with whole wheat pasta.   Protein power -  one-quarter of your plate: Fish, chicken, beans, and nuts are all healthy, versatile protein sources. Limit red meat.   The diet, however, does go beyond the plate, offering a few other suggestions.   Use healthy plant oils, such as olive, canola, soy, corn, sunflower and peanut. Check the labels, and avoid partially hydrogenated oil, which have unhealthy trans fats.   If youre thirsty, drink water. Coffee and tea are good in moderation, but skip sugary drinks and limit milk and dairy products to one or two daily servings.   The type of carbohydrate in the diet is more important than the amount. Some sources of carbohydrates, such as vegetables, fruits, whole grains, and beans-are healthier than others.   Finally, stay active  Signed, Berniece Salines, DO  09/18/2020 3:04 PM    [facility]

## 2020-09-24 ENCOUNTER — Telehealth: Payer: Self-pay | Admitting: Cardiology

## 2020-09-24 NOTE — Telephone Encounter (Signed)
Pt returning call for Lab results

## 2020-10-02 ENCOUNTER — Ambulatory Visit: Payer: BC Managed Care – PPO | Admitting: Sports Medicine

## 2020-10-19 NOTE — Telephone Encounter (Signed)
Tyler Snyder, lets hold off on colonoscopy until URI symptoms are better Lets reschedule for 3 to 4 weeks RG

## 2020-10-22 ENCOUNTER — Encounter: Payer: BC Managed Care – PPO | Admitting: Gastroenterology

## 2020-10-23 ENCOUNTER — Encounter: Payer: BC Managed Care – PPO | Admitting: Gastroenterology

## 2020-11-17 ENCOUNTER — Other Ambulatory Visit: Payer: Self-pay | Admitting: *Deleted

## 2020-11-17 MED ORDER — METOPROLOL SUCCINATE ER 25 MG PO TB24: 12.5000 mg | ORAL_TABLET | Freq: Every day | ORAL | 3 refills | Status: DC

## 2020-11-17 NOTE — Telephone Encounter (Signed)
Rx refill sent to pharmacy. 

## 2020-12-21 ENCOUNTER — Ambulatory Visit (AMBULATORY_SURGERY_CENTER): Payer: BC Managed Care – PPO | Admitting: Gastroenterology

## 2020-12-21 ENCOUNTER — Encounter: Payer: Self-pay | Admitting: Gastroenterology

## 2020-12-21 ENCOUNTER — Other Ambulatory Visit: Payer: Self-pay

## 2020-12-21 VITALS — BP 122/85 | HR 80 | Temp 98.0°F | Resp 17 | Ht 69.0 in | Wt 267.0 lb

## 2020-12-21 DIAGNOSIS — D123 Benign neoplasm of transverse colon: Secondary | ICD-10-CM | POA: Diagnosis not present

## 2020-12-21 DIAGNOSIS — K641 Second degree hemorrhoids: Secondary | ICD-10-CM | POA: Diagnosis not present

## 2020-12-21 DIAGNOSIS — K5730 Diverticulosis of large intestine without perforation or abscess without bleeding: Secondary | ICD-10-CM

## 2020-12-21 DIAGNOSIS — K625 Hemorrhage of anus and rectum: Secondary | ICD-10-CM

## 2020-12-21 MED ORDER — SODIUM CHLORIDE 0.9 % IV SOLN: 500.0000 mL | INTRAVENOUS | Status: DC

## 2020-12-21 NOTE — Progress Notes (Signed)
Pt's states no medical or surgical changes since previsit or office visit. 

## 2020-12-21 NOTE — Progress Notes (Signed)
Called to room to assist during endoscopic procedure.  Patient ID and intended procedure confirmed with present staff. Received instructions for my participation in the procedure from the performing physician.  

## 2020-12-21 NOTE — Patient Instructions (Signed)
Discharge instructions given. Handouts on polyps,diverticulosis and hemorrhoids. Resume previous medications. YOU HAD AN ENDOSCOPIC PROCEDURE TODAY AT [facility]:   Refer to the procedure report that was given to you for any specific questions about what was found during the examination.  If the procedure report does not answer your questions, please call your gastroenterologist to clarify.  If you requested that your care partner not be given the details of your procedure findings, then the procedure report has been included in a sealed envelope for you to review at your convenience later.  YOU SHOULD EXPECT: Some feelings of bloating in the abdomen. Passage of more gas than usual.  Walking can help get rid of the air that was put into your GI tract during the procedure and reduce the bloating. If you had a lower endoscopy (such as a colonoscopy or flexible sigmoidoscopy) you may notice spotting of blood in your stool or on the toilet paper. If you underwent a bowel prep for your procedure, you may not have a normal bowel movement for a few days.  Please Note:  You might notice some irritation and congestion in your nose or some drainage.  This is from the oxygen used during your procedure.  There is no need for concern and it should clear up in a day or so.  SYMPTOMS TO REPORT IMMEDIATELY:  Following lower endoscopy (colonoscopy or flexible sigmoidoscopy):  Excessive amounts of blood in the stool  Significant tenderness or worsening of abdominal pains  Swelling of the abdomen that is new, acute  Fever of 100F or higher  For urgent or emergent issues, a gastroenterologist can be reached at any hour by calling [phone]. Do not use MyChart messaging for urgent concerns.    DIET:  We do recommend a small meal at first, but then you may proceed to your regular diet.  Drink plenty of fluids but you should avoid alcoholic beverages for 24 hours.  ACTIVITY:  You should plan  to take it easy for the rest of today and you should NOT DRIVE or use heavy machinery until tomorrow (because of the sedation medicines used during the test).    FOLLOW UP: Our staff will call the number listed on your records 48-72 hours following your procedure to check on you and address any questions or concerns that you may have regarding the information given to you following your procedure. If we do not reach you, we will leave a message.  We will attempt to reach you two times.  During this call, we will ask if you have developed any symptoms of COVID 19. If you develop any symptoms (ie: fever, flu-like symptoms, shortness of breath, cough etc.) before then, please call [phone].  If you test positive for Covid 19 in the 2 weeks post procedure, please call and report this information to [place].    If any biopsies were taken you will be contacted by phone or by letter within the next 1-3 weeks.  Please call us at [phone] if you have not heard about the biopsies in 3 weeks.    SIGNATURES/CONFIDENTIALITY: You and/or your care partner have signed paperwork which will be entered into your electronic medical record.  These signatures attest to the fact that that the information above on your After Visit Summary has been reviewed and is understood.  Full responsibility of the confidentiality of this discharge information lies with you and/or your care-partner.

## 2020-12-21 NOTE — Progress Notes (Signed)
To PACU, VSS. Report to [email or web address] 

## 2020-12-21 NOTE — Op Note (Signed)
[place] Patient Name: Tyler Snyder Procedure Date: 12/21/2020 7:29 AM MRN: [id number] Endoscopist: Jackquline Denmark , MD Age: 40 Referring MD:  Date of Birth: Jul 02, 1980 Gender: Male Account #: [id number] Procedure:                Colonoscopy Indications:              Rectal bleeding Medicines:                Monitored Anesthesia Care Procedure:                Pre-Anesthesia Assessment:                           - Prior to the procedure, a History and Physical                            was performed, and patient medications and                            allergies were reviewed. The patient's tolerance of                            previous anesthesia was also reviewed. The risks                            and benefits of the procedure and the sedation                            options and risks were discussed with the patient.                            All questions were answered, and informed consent                            was obtained. Prior Anticoagulants: The patient has                            taken no previous anticoagulant or antiplatelet                            agents. ASA Grade Assessment: II - A patient with                            mild systemic disease. After reviewing the risks                            and benefits, the patient was deemed in                            satisfactory condition to undergo the procedure.                           After obtaining informed consent, the colonoscope  was passed under direct vision. Throughout the                            procedure, the patient's blood pressure, pulse, and                            oxygen saturations were monitored continuously. The                            CF [id number] #[id number] was introduced through the anus                            and advanced to the 2 cm into the ileum. The                            colonoscopy was performed without difficulty.  The                            patient tolerated the procedure well. The quality                            of the bowel preparation was good. The terminal                            ileum, ileocecal valve, appendiceal orifice, and                            rectum were photographed. Scope In: 8:06:57 AM Scope Out: 8:21:12 AM Scope Withdrawal Time: 0 hours 11 minutes 31 seconds  Total Procedure Duration: 0 hours 14 minutes 15 seconds  Findings:                 A 6 mm polyp was found in the proximal transverse                            colon. The polyp was sessile. The polyp was removed                            with a cold snare. Resection and retrieval were                            complete.                           A few rare small-mouthed diverticula were found in                            the sigmoid colon.                           Non-bleeding internal hemorrhoids were found during                            retroflexion. The hemorrhoids were small and Grade  I (internal hemorrhoids that do not prolapse).                           The terminal ileum appeared normal.                           The exam was otherwise without abnormality on                            direct and retroflexion views.                           The entire examined colon appeared normal. Complications:            No immediate complications. Estimated Blood Loss:     Estimated blood loss: none. Impression:               - One 6 mm polyp in the proximal transverse colon,                            removed with a cold snare. Resected and retrieved.                           - Minimal sigmoid diverticulosis.                           - Non-bleeding internal hemorrhoids.                           - The examined portion of the ileum was normal.                           - The examination was otherwise normal on direct                            and retroflexion views.                            - The entire examined colon is normal. Recommendation:           - Patient has a contact number available for                            emergencies. The signs and symptoms of potential                            delayed complications were discussed with the                            patient. Return to normal activities tomorrow.                            Written discharge instructions were provided to the                            patient.                           -  High fiber diet.                           - Continue present medications.                           - Await pathology results.                           - Repeat colonoscopy for surveillance based on                            pathology results.                           - Preparation H ointment: Apply externally BID PRN                            for 10 days.                           - The findings and recommendations were discussed                            with the patient's family. Jackquline Denmark, MD 12/21/2020 8:28:04 AM This report has been signed electronically.

## 2020-12-23 ENCOUNTER — Telehealth: Payer: Self-pay

## 2020-12-23 NOTE — Telephone Encounter (Signed)
LVM

## 2020-12-24 ENCOUNTER — Encounter: Payer: Self-pay | Admitting: Gastroenterology

## 2021-01-18 ENCOUNTER — Telehealth: Payer: Self-pay

## 2021-01-18 NOTE — Telephone Encounter (Signed)
Transition Care Management Unsuccessful Follow-up Telephone Call  Date of discharge and from where:  01/17/2021 from [facility]  Attempts:  1st Attempt  Reason for unsuccessful TCM follow-up call:  Left voice message

## 2021-01-19 NOTE — Telephone Encounter (Addendum)
Transition Care Management Unsuccessful Follow-up Telephone Call  Date of discharge and from where:  01/17/21 from [facility]  Attempts:  2nd Attempt  Reason for unsuccessful TCM follow-up call:  Left voice message

## 2021-01-20 NOTE — Telephone Encounter (Addendum)
Transition Care Management Unsuccessful Follow-up Telephone Call  Date of discharge and from where:  01/17/21 from [facility]   Attempts:  3rd Attempt  Reason for unsuccessful TCM follow-up call:  Left voice message

## 2021-02-05 ENCOUNTER — Other Ambulatory Visit: Payer: Self-pay

## 2021-02-05 DIAGNOSIS — I10 Essential (primary) hypertension: Secondary | ICD-10-CM

## 2021-02-05 DIAGNOSIS — R0789 Other chest pain: Secondary | ICD-10-CM

## 2021-02-05 DIAGNOSIS — R9431 Abnormal electrocardiogram [ECG] [EKG]: Secondary | ICD-10-CM

## 2021-02-05 MED ORDER — ATORVASTATIN CALCIUM 40 MG PO TABS: 40.0000 mg | ORAL_TABLET | Freq: Every day | ORAL | 0 refills | Status: DC

## 2021-02-05 MED ORDER — ASPIRIN EC 81 MG PO TBEC: 81.0000 mg | DELAYED_RELEASE_TABLET | Freq: Every day | ORAL | 0 refills | Status: AC

## 2021-05-05 ENCOUNTER — Other Ambulatory Visit: Payer: Self-pay | Admitting: Osteopathic Medicine

## 2021-05-05 DIAGNOSIS — R0789 Other chest pain: Secondary | ICD-10-CM

## 2021-05-05 DIAGNOSIS — R9431 Abnormal electrocardiogram [ECG] [EKG]: Secondary | ICD-10-CM

## 2021-05-05 DIAGNOSIS — I10 Essential (primary) hypertension: Secondary | ICD-10-CM

## 2021-05-14 ENCOUNTER — Other Ambulatory Visit: Payer: Self-pay | Admitting: Osteopathic Medicine

## 2021-05-14 DIAGNOSIS — R0789 Other chest pain: Secondary | ICD-10-CM

## 2021-05-14 DIAGNOSIS — I10 Essential (primary) hypertension: Secondary | ICD-10-CM

## 2021-05-14 DIAGNOSIS — R9431 Abnormal electrocardiogram [ECG] [EKG]: Secondary | ICD-10-CM

## 2021-07-22 IMAGING — DX DG ELBOW COMPLETE 3+V*L*
3 series · 3 of 3 positions shown · non-contrast
Comparison: None.

CLINICAL DATA: Lateral elbow pain for 3 weeks

EXAM:
LEFT ELBOW - COMPLETE 3+ VIEW

[elbow ap]
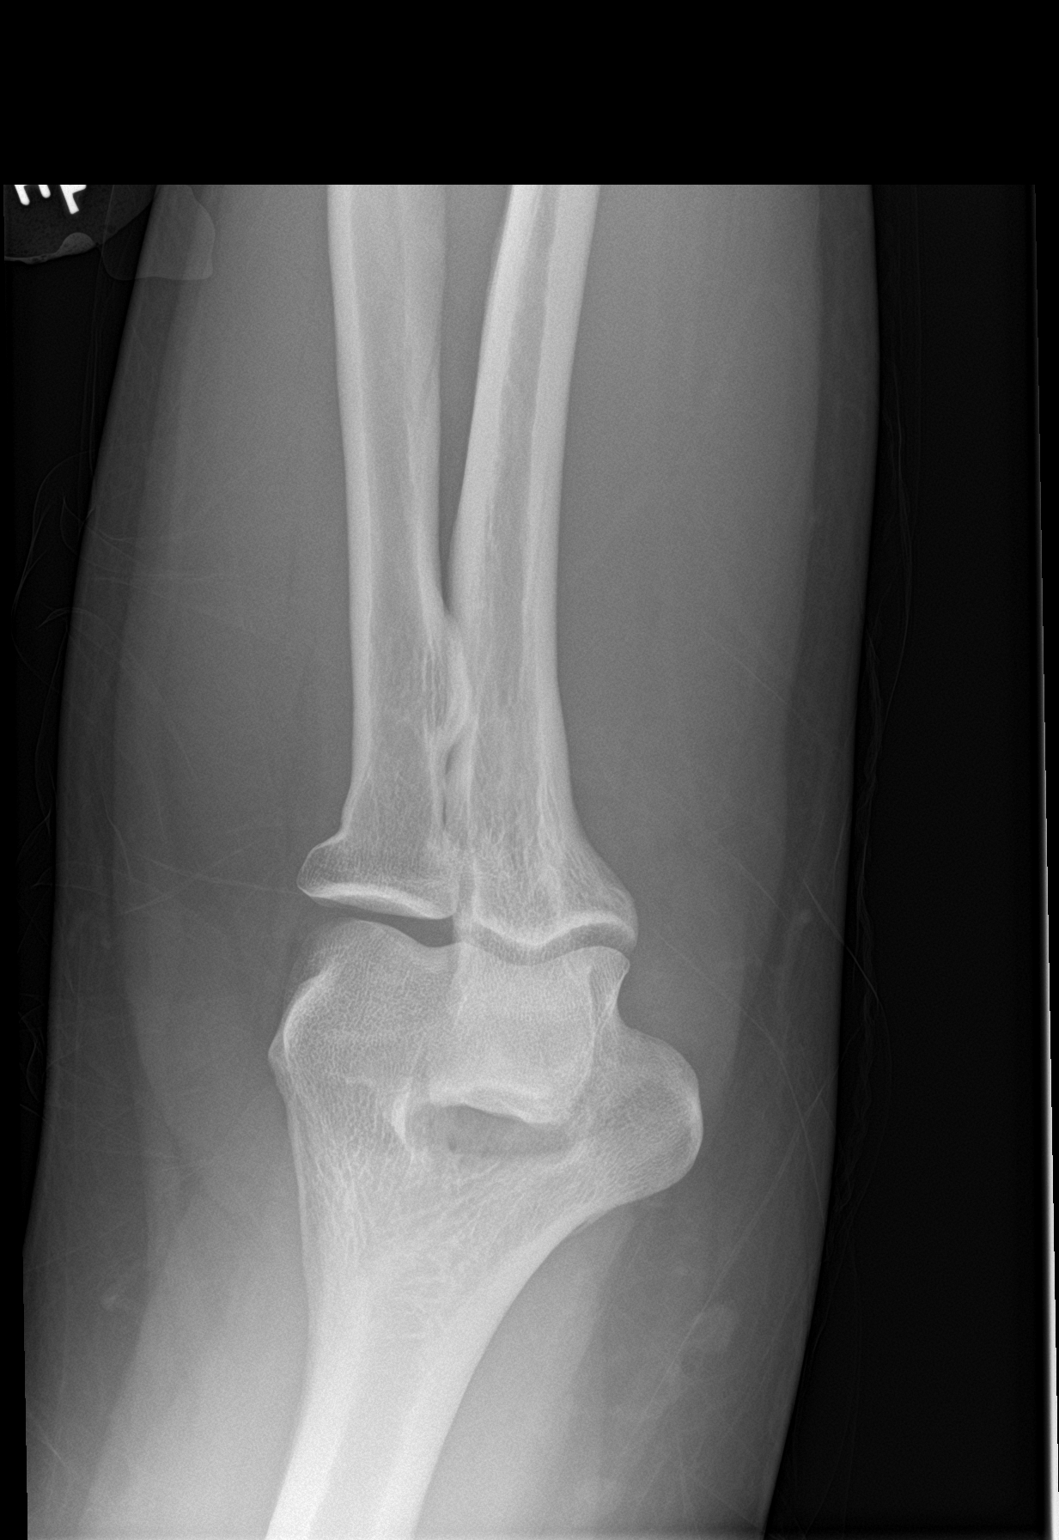

[elbow obl]
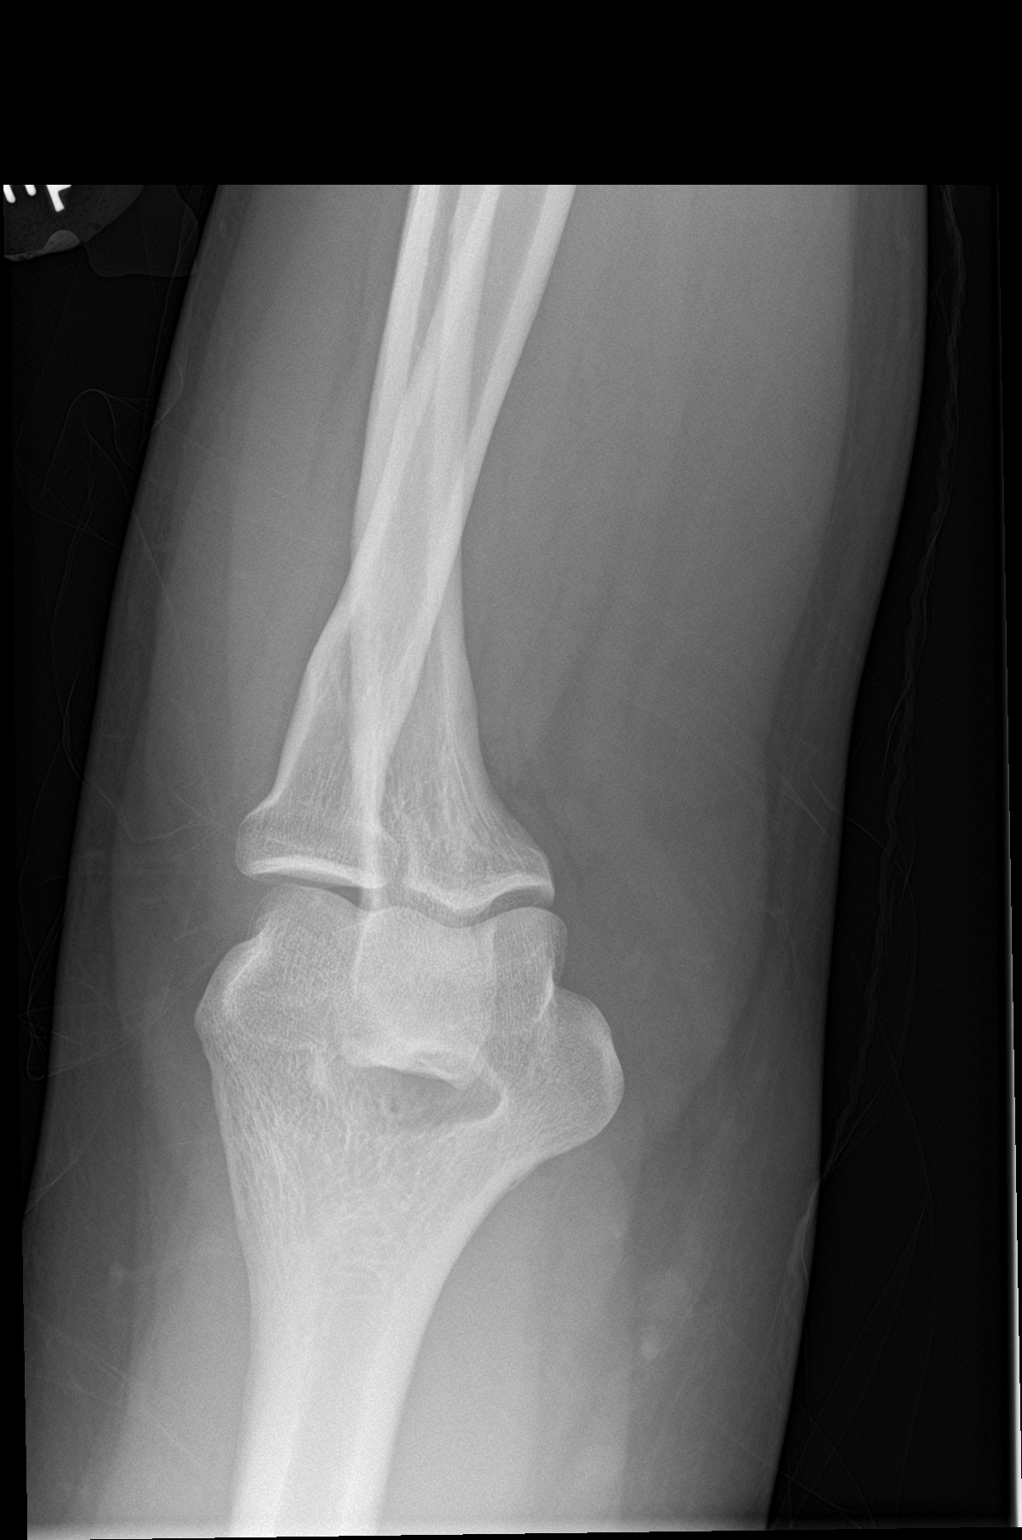

[elbow lat]
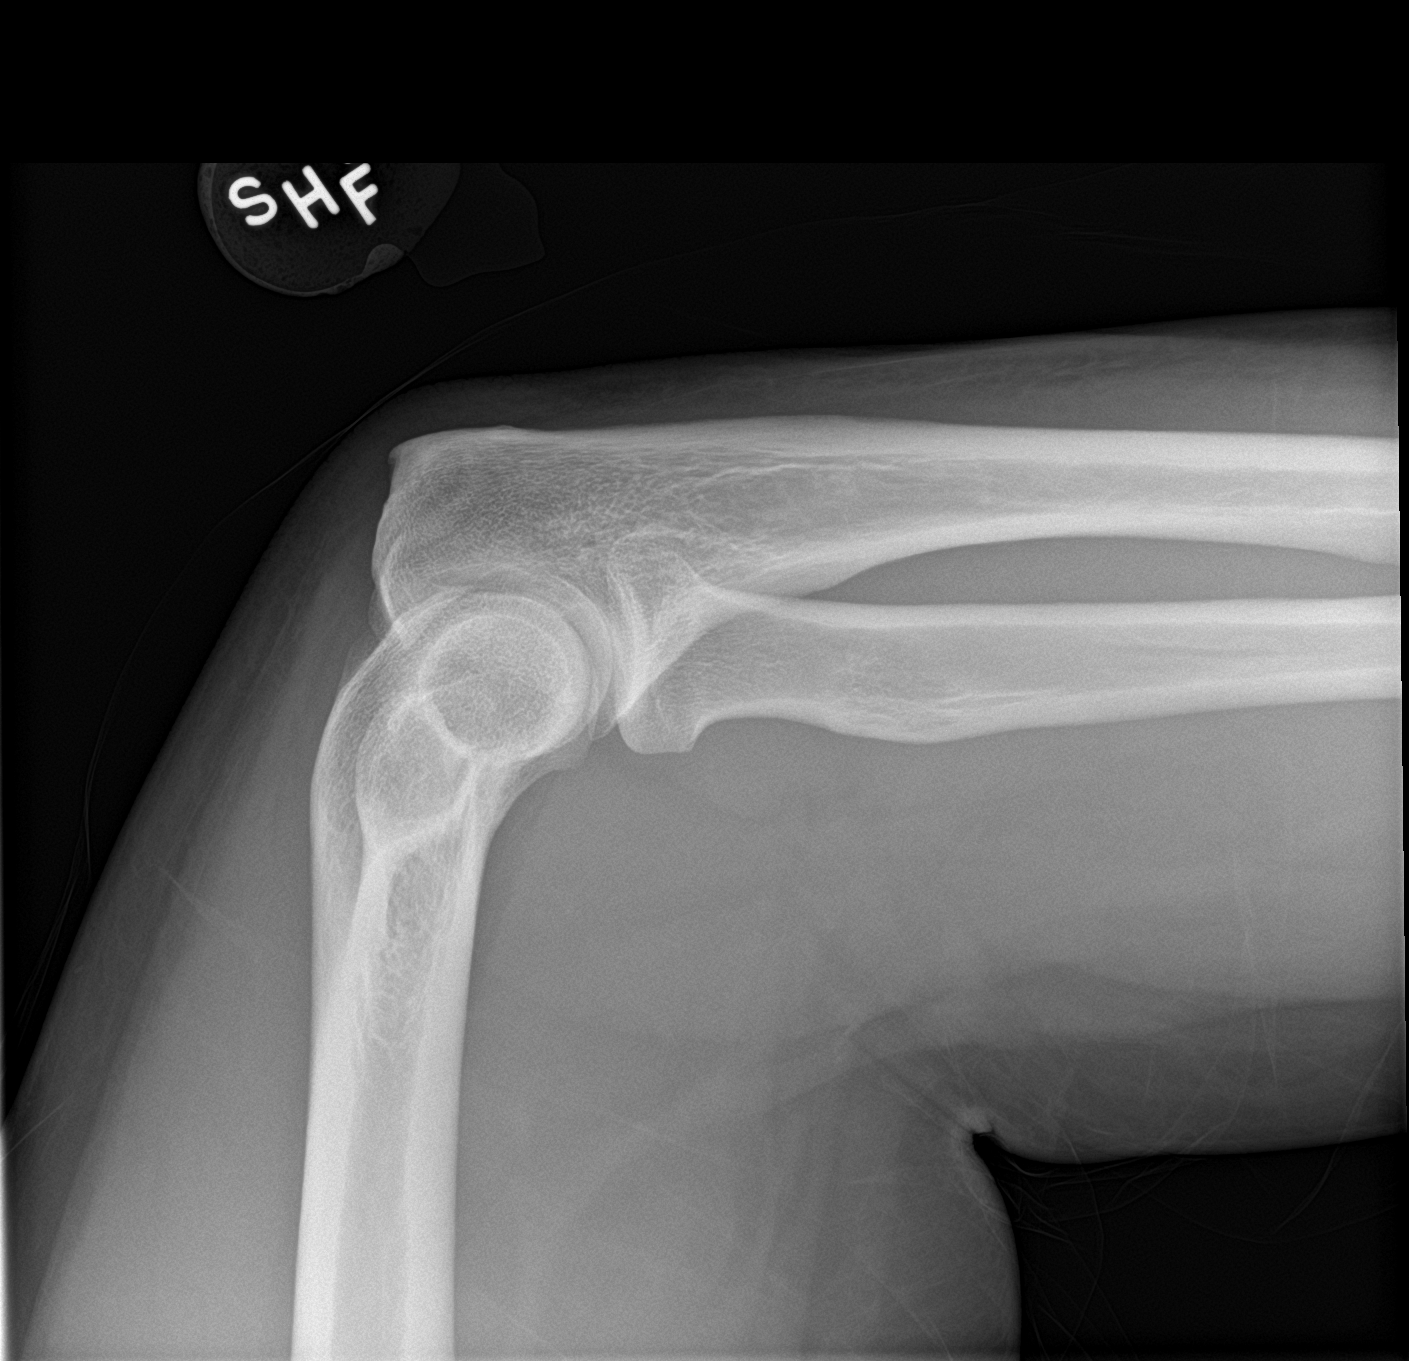

[3 of 3 positions shown; findings below may reference images not displayed]

FINDINGS: There is no evidence of fracture, dislocation, or joint effusion.
There is no evidence of arthropathy or other focal bone abnormality.
Soft tissues are unremarkable.
IMPRESSION: Negative.

## 2021-10-08 ENCOUNTER — Other Ambulatory Visit: Payer: Self-pay

## 2021-10-08 MED ORDER — HYDROCHLOROTHIAZIDE 25 MG PO TABS: 25.0000 mg | ORAL_TABLET | Freq: Every day | ORAL | 0 refills | Status: AC

## 2021-12-06 ENCOUNTER — Other Ambulatory Visit: Payer: Self-pay | Admitting: Cardiology

## 2021-12-06 NOTE — Telephone Encounter (Signed)
Rx refill sent to pharmacy. 

## 2022-01-11 ENCOUNTER — Other Ambulatory Visit: Payer: Self-pay | Admitting: Cardiology

## 2024-04-09 ENCOUNTER — Ambulatory Visit (HOSPITAL_BASED_OUTPATIENT_CLINIC_OR_DEPARTMENT_OTHER): Admit: 2024-04-09 | Discharge: 2024-04-09 | Disposition: A | Admitting: Radiology

## 2024-04-09 ENCOUNTER — Ambulatory Visit (HOSPITAL_BASED_OUTPATIENT_CLINIC_OR_DEPARTMENT_OTHER): Admission: EM | Admit: 2024-04-09 | Discharge: 2024-04-09 | Disposition: A | Discharge status: Home / Self Care

## 2024-04-09 ENCOUNTER — Encounter (HOSPITAL_BASED_OUTPATIENT_CLINIC_OR_DEPARTMENT_OTHER): Payer: Self-pay | Admitting: Emergency Medicine

## 2024-04-09 DIAGNOSIS — W19XXXA Unspecified fall, initial encounter: Secondary | ICD-10-CM | POA: Diagnosis not present

## 2024-04-09 DIAGNOSIS — R0789 Other chest pain: Secondary | ICD-10-CM

## 2024-04-09 DIAGNOSIS — S298XXA Other specified injuries of thorax, initial encounter: Secondary | ICD-10-CM

## 2024-04-09 DIAGNOSIS — W010XXA Fall on same level from slipping, tripping and stumbling without subsequent striking against object, initial encounter: Secondary | ICD-10-CM | POA: Diagnosis not present

## 2024-04-09 NOTE — ED Provider Notes (Signed)
 [facility]    CSN: [id number] Arrival date & time: 04/09/24  1743      History   Chief Complaint Chief Complaint  Patient presents with   Rib Injury   Fall    HPI Tyler Snyder is a 43 y.o. male.   43 year old male who was walking his dog on 03/29/2024.  His dog tripped him and he fell on his right side.  He had rib pain on the right but it was improving and by 04/04/2024 he was feeling better.  But over the next day or so he did a lot of bending and moving in his garage and changed oil for a car and the rib pain came back significantly on 04/06/2024.  He is concerned he has a rib fracture.   Fall Associated symptoms include chest pain (Right rib pain after a fall).    Past Medical History:  Diagnosis Date   Abnormal weight gain 02/14/2019   Acute bacterial conjunctivitis of right eye 01/16/2018   Acute non-recurrent maxillary sinusitis 01/16/2018   Chronic fatigue 02/14/2019   Elevated liver enzymes 02/28/2019   Essential hypertension 02/14/2019   Hypertension    Palpitation 02/14/2019   Sorethroat 01/16/2018    Patient Active Problem List   Diagnosis Date Noted   Morbid obesity (HCC) 09/18/2020   Lateral epicondylitis, left elbow 08/20/2020   Hypertension    Other fatigue 02/14/2020   Dyspnea on exertion 02/14/2020   Precordial chest pain 02/14/2020   Dyslipidemia (high LDL; low HDL) 02/14/2020   Elevated liver enzymes 02/28/2019   Essential hypertension 02/14/2019   BMI 37.0-37.9, adult 02/14/2019   Abnormal weight gain 02/14/2019   Chronic fatigue 02/14/2019   Palpitation 02/14/2019   Acute bacterial conjunctivitis of right eye 01/16/2018   Acute non-recurrent maxillary sinusitis 01/16/2018   Elevated BP without diagnosis of hypertension 01/16/2018   Sorethroat 01/16/2018    Past Surgical History:  Procedure Laterality Date   WISDOM TOOTH EXTRACTION         Home Medications    Prior to Admission medications   Medication Sig Start Date  End Date Taking? Authorizing Provider  aspirin  EC 81 MG tablet Take 1 tablet (81 mg total) by mouth daily. NO REFILLS. OVERDUE FOR A VISIT. 02/05/21  Yes Alexander, Natalie, DO  ezetimibe (ZETIA) 10 MG tablet Take 10 mg by mouth daily.   Yes [provider]  hydrochlorothiazide  (HYDRODIURIL ) 25 MG tablet Take 1 tablet (25 mg total) by mouth daily. 10/08/21  Yes Tobb, Kardie, DO  losartan  (COZAAR ) 50 MG tablet Take 1 tablet (50 mg total) by mouth daily. 02/19/20  Yes Alexander, Natalie, DO  atorvastatin  (LIPITOR) 40 MG tablet Take 1 tablet (40 mg total) by mouth daily. NO REFILLS. NEEDS TO TRANSITION CARE TO NEW PCP. 05/05/21   Curtis Debby PARAS, MD  losartan  (COZAAR ) 25 MG tablet Take 1 tablet (25 mg total) by mouth daily. Daily after dinner. 05/29/20 12/21/20  Tobb, Kardie, DO  meloxicam  (MOBIC ) 15 MG tablet One tab PO qAM with a meal for 2 weeks, then daily prn pain. Patient not taking: Reported on 12/21/2020 08/20/20   Curtis Debby PARAS, MD  metoprolol  succinate (TOPROL -XL) 25 MG 24 hr tablet TAKE 1/2 TABLET(12.5 MG) BY MOUTH DAILY 12/06/21   Tobb, Kardie, DO  nitroGLYCERIN  (NITROSTAT ) 0.4 MG SL tablet Place 1 tablet (0.4 mg total) under the tongue every 5 (five) minutes as needed. Patient not taking: Reported on 12/21/2020 02/14/20 05/14/20  Tobb, Kardie, DO  Family History Family History  Problem Relation Age of Onset   High blood pressure Mother    Hypertension Mother    Rheum arthritis Mother    Hyperthyroidism Father    Heart disease Paternal Uncle    Heart disease Paternal Uncle    Heart disease Paternal Uncle    Heart disease Paternal Uncle    Colon cancer Neg Hx    Esophageal cancer Neg Hx    Kidney disease Neg Hx    Liver cancer Neg Hx    Pancreatic cancer Neg Hx    Prostate cancer Neg Hx    Rectal cancer Neg Hx     Social History Social History   Tobacco Use   Smoking status: Never   Smokeless tobacco: Never  Vaping Use   Vaping status: Never Used   Substance Use Topics   Alcohol use: Never   Drug use: Never     Allergies   Codeine   Review of Systems Review of Systems  Cardiovascular:  Positive for chest pain (Right rib pain after a fall).     Physical Exam Triage Vital Signs ED Triage Vitals  Encounter Vitals Group     BP 04/09/24 1756 (!) 143/83     Girls Systolic BP Percentile --      Girls Diastolic BP Percentile --      Boys Systolic BP Percentile --      Boys Diastolic BP Percentile --      Pulse Rate 04/09/24 1756 90     Resp 04/09/24 1756 20     Temp 04/09/24 1756 98.1 F (36.7 C)     Temp Source 04/09/24 1756 Oral     SpO2 04/09/24 1756 97 %     Weight --      Height --      Head Circumference --      Peak Flow --      Pain Score 04/09/24 1754 7     Pain Loc --      Pain Education --      Exclude from Growth Chart --    No data found.  Updated Vital Signs BP (!) 143/83 (BP Location: Right Arm)   Pulse 90   Temp 98.1 F (36.7 C) (Oral)   Resp 20   SpO2 97%   Visual Acuity Right Eye Distance:   Left Eye Distance:   Bilateral Distance:    Right Eye Near:   Left Eye Near:    Bilateral Near:     Physical Exam Vitals and nursing note reviewed.  Constitutional:      General: He is not in acute distress.    Appearance: He is well-developed. He is not ill-appearing or toxic-appearing.  HENT:     Head: Normocephalic and atraumatic.     Right Ear: Hearing, tympanic membrane, ear canal and external ear normal.     Left Ear: Hearing, tympanic membrane, ear canal and external ear normal.     Nose: No congestion or rhinorrhea.     Right Sinus: No maxillary sinus tenderness or frontal sinus tenderness.     Left Sinus: No maxillary sinus tenderness or frontal sinus tenderness.     Mouth/Throat:     Lips: Pink.     Mouth: Mucous membranes are moist.     Pharynx: Uvula midline. No oropharyngeal exudate or posterior oropharyngeal erythema.     Tonsils: No tonsillar exudate.  Eyes:      Conjunctiva/sclera: Conjunctivae normal.     Pupils:  Pupils are equal, round, and reactive to light.  Cardiovascular:     Rate and Rhythm: Normal rate and regular rhythm.     Heart sounds: S1 normal and S2 normal. No murmur heard. Pulmonary:     Effort: Pulmonary effort is normal. No respiratory distress.     Breath sounds: Normal breath sounds. No decreased breath sounds, wheezing, rhonchi or rales.  Abdominal:     General: Bowel sounds are normal.     Palpations: Abdomen is soft.     Tenderness: There is no abdominal tenderness.  Musculoskeletal:        General: No swelling.     Cervical back: Neck supple.  Lymphadenopathy:     Head:     Right side of head: No submental, submandibular, tonsillar, preauricular or posterior auricular adenopathy.     Left side of head: No submental, submandibular, tonsillar, preauricular or posterior auricular adenopathy.     Cervical: No cervical adenopathy.     Right cervical: No superficial cervical adenopathy.    Left cervical: No superficial cervical adenopathy.  Skin:    General: Skin is warm and dry.     Capillary Refill: Capillary refill takes less than 2 seconds.     Findings: No rash.  Neurological:     Mental Status: He is alert and oriented to person, place, and time.  Psychiatric:        Mood and Affect: Mood normal.      UC Treatments / Results  Labs (all labs ordered are listed, but only abnormal results are displayed) Labs Reviewed - No data to display  EKG   Radiology DG Ribs Unilateral W/Chest Right Result Date: 04/09/2024 EXAM: 1 VIEW(S) XRAY OF THE RIGHT RIBS AND CHEST 04/09/2024 06:13:06 PM COMPARISON: 01/28/2020 CLINICAL HISTORY: Fall on right side. Initial improvement, but pain recurred after activity. Rib injury suspected. FINDINGS: BONES: No acute displaced rib fracture. LUNGS AND PLEURA: No consolidation or pulmonary edema. No pleural effusion or pneumothorax. HEART AND MEDIASTINUM: No acute abnormality of the  cardiac and mediastinal silhouettes. IMPRESSION: 1. No acute rib fracture. Electronically signed by: Dayne Hassell MD 04/09/2024 06:20 PM EDT RP Workstation: [id number]    Procedures Procedures (including critical care time)  Medications Ordered in UC Medications - No data to display  Initial Impression / Assessment and Plan / UC Course  I have reviewed the triage vital signs and the nursing notes.  Pertinent labs & imaging results that were available during my care of the patient were reviewed by me and considered in my medical decision making (see chart for details).  Plan of Care: Contusion and pain of right ribs after a fall: X-ray is negative for fractures of his ribs.  Offered NSAIDs but patient declined.  He does not like taking a lot of medicine.  Encouraged use of ice therapy (ice packs 30 minutes on and 30 minutes off and repeat as often as needed) for 24 to 48 hours.  Then switch to heat therapy (heat packs 30 minutes on and 30 minutes off and repeat as often as needed).   Offered work excuse but patient declined.  Follow-up if symptoms do not improve, if symptoms worsen or new symptoms occur.  I reviewed the plan of care with the patient and/or the patient's guardian.  The patient and/or guardian had time to ask questions and acknowledged that the questions were answered.  I provided instruction on symptoms or reasons to return here or to go to an ER, if symptoms/condition did  not improve, worsened or if new symptoms occurred.  Final Clinical Impressions(s) / UC Diagnoses   Final diagnoses:  Fall on same level from slipping, tripping or stumbling, initial encounter  Rib pain on right side  Rib contusion, right, initial encounter     Discharge Instructions      Contusion and pain of right ribs after a fall: X-ray is negative for fractures of his ribs.  Offered NSAIDs but patient declined.  He does not like taking a lot of medicine.  Encouraged use of ice therapy (ice  packs 30 minutes on and 30 minutes off and repeat as often as needed) for 24 to 48 hours.  Then switch to heat therapy (heat packs 30 minutes on and 30 minutes off and repeat as often as needed).   Offered work excuse but patient declined.  Follow-up if symptoms do not improve, if symptoms worsen or new symptoms occur.     ED Prescriptions   None    PDMP not reviewed this encounter.   Ival Domino, FNP 04/09/24 [phone]

## 2024-04-09 NOTE — Discharge Instructions (Addendum)
 Contusion and pain of right ribs after a fall: X-ray is negative for fractures of his ribs.  Offered NSAIDs but patient declined.  He does not like taking a lot of medicine.  Encouraged use of ice therapy (ice packs 30 minutes on and 30 minutes off and repeat as often as needed) for 24 to 48 hours.  Then switch to heat therapy (heat packs 30 minutes on and 30 minutes off and repeat as often as needed).   Offered work excuse but patient declined.  Follow-up if symptoms do not improve, if symptoms worsen or new symptoms occur.

## 2024-04-09 NOTE — ED Triage Notes (Signed)
 Pt reports on the 3rd his dog tripped him up and he fell on his right side. Pain did get better last Thursday but he changed oil and done a few other things and the pain came back over the weekend.
